# Patient Record
Sex: Female | Born: 1950
Health system: Southern US, Community
[De-identification: ages and names within clinical notes are randomized; demographics above are authoritative.]

## PROBLEM LIST (undated history)

## (undated) DIAGNOSIS — F32A Depression, unspecified: Secondary | ICD-10-CM

## (undated) DIAGNOSIS — R911 Solitary pulmonary nodule: Secondary | ICD-10-CM

## (undated) DIAGNOSIS — E785 Hyperlipidemia, unspecified: Secondary | ICD-10-CM

## (undated) DIAGNOSIS — Z8619 Personal history of other infectious and parasitic diseases: Secondary | ICD-10-CM

## (undated) DIAGNOSIS — C8290 Follicular lymphoma, unspecified, unspecified site: Secondary | ICD-10-CM

## (undated) DIAGNOSIS — I2510 Atherosclerotic heart disease of native coronary artery without angina pectoris: Secondary | ICD-10-CM

## (undated) DIAGNOSIS — M8580 Other specified disorders of bone density and structure, unspecified site: Secondary | ICD-10-CM

## (undated) DIAGNOSIS — H01139 Eczematous dermatitis of unspecified eye, unspecified eyelid: Secondary | ICD-10-CM

## (undated) DIAGNOSIS — I10 Essential (primary) hypertension: Secondary | ICD-10-CM

## (undated) DIAGNOSIS — T7840XA Allergy, unspecified, initial encounter: Secondary | ICD-10-CM

## (undated) DIAGNOSIS — F329 Major depressive disorder, single episode, unspecified: Secondary | ICD-10-CM

## (undated) DIAGNOSIS — K589 Irritable bowel syndrome without diarrhea: Secondary | ICD-10-CM

## (undated) HISTORY — PX: TONSILLECTOMY: SUR1361

## (undated) HISTORY — DX: Hyperlipidemia, unspecified: E78.5

## (undated) HISTORY — DX: Personal history of other infectious and parasitic diseases: Z86.19

## (undated) HISTORY — DX: Allergy, unspecified, initial encounter: T78.40XA

## (undated) HISTORY — PX: TUBAL LIGATION: SHX77

---

## 1987-05-20 HISTORY — PX: BREAST EXCISIONAL BIOPSY: SUR124

## 1987-05-20 HISTORY — PX: BREAST BIOPSY: SHX20

## 2007-12-01 LAB — HM COLONOSCOPY: HM Colonoscopy: NORMAL

## 2010-06-02 LAB — HM PAP SMEAR: HM Pap smear: NORMAL

## 2011-05-08 ENCOUNTER — Ambulatory Visit (INDEPENDENT_AMBULATORY_CARE_PROVIDER_SITE_OTHER): Payer: BC Managed Care – PPO | Admitting: Internal Medicine

## 2011-05-08 ENCOUNTER — Encounter: Payer: Self-pay | Admitting: Internal Medicine

## 2011-05-08 VITALS — BP 110/70 | HR 60 | Temp 98.3°F | Ht 61.75 in | Wt 133.2 lb

## 2011-05-08 DIAGNOSIS — Z79899 Other long term (current) drug therapy: Secondary | ICD-10-CM

## 2011-05-08 DIAGNOSIS — Z1239 Encounter for other screening for malignant neoplasm of breast: Secondary | ICD-10-CM

## 2011-05-08 DIAGNOSIS — F17200 Nicotine dependence, unspecified, uncomplicated: Secondary | ICD-10-CM

## 2011-05-08 DIAGNOSIS — Z716 Tobacco abuse counseling: Secondary | ICD-10-CM | POA: Insufficient documentation

## 2011-05-08 DIAGNOSIS — Z720 Tobacco use: Secondary | ICD-10-CM | POA: Insufficient documentation

## 2011-05-08 DIAGNOSIS — Z7189 Other specified counseling: Secondary | ICD-10-CM

## 2011-05-08 DIAGNOSIS — E785 Hyperlipidemia, unspecified: Secondary | ICD-10-CM

## 2011-05-08 MED ORDER — FLUTICASONE PROPIONATE 50 MCG/ACT NA SUSP: 2.0000 | Freq: Every day | NASAL | Status: DC

## 2011-05-08 MED ORDER — ZOLPIDEM TARTRATE 10 MG PO TABS: 5.0000 mg | ORAL_TABLET | Freq: Every evening | ORAL | Status: DC | PRN

## 2011-05-08 MED ORDER — LOVASTATIN 40 MG PO TABS: 40.0000 mg | ORAL_TABLET | Freq: Every day | ORAL | Status: DC

## 2011-05-08 NOTE — Progress Notes (Signed)
  Subjective:    Patient ID: Nicole York, female    DOB: Jul 25, 1950, 60 y.o.   MRN: [id number]  HPI  New patient ,referred by Annett Fabian,  Here for annual exam Last PAP [id number], no ptior abnormals.    Hi  Review of Systems     Objective:   Physical Exam     Assessment & Plan:   Subjective:    Nicole York is a 60 y.o. female who presents for an annual exam.The patient is sexually active. GYN screening history: last pap: was normal and approximate date 2011 and was normal. The patient wears seatbelts: yes. The patient participates in regular exercise: yes. Has the patient ever been transfused or tattooed?: no. The patient reports that there is not domestic violence in her life.   Menstrual History: OB History    Grav Para Term Preterm Abortions TAB SAB Ect Mult Living                  Menarche age: 25 No LMP recorded. Patient is postmenopausal.    The following portions of the patient's history were reviewed and updated as appropriate: allergies, current medications, past family history, past medical history, past social history, past surgical history and problem list.  Review of Systems A comprehensive review of systems was negative except for: Ears, nose, mouth, throat, and face: positive for nasal congestion    Objective:    BP 110/70  Pulse 60  Temp(Src) 98.3 F (36.8 C) (Oral)  Ht 5' 1.75" (1.568 m)  Wt 133 lb 4 oz (60.442 kg)  BMI 24.57 kg/m2 General appearance: alert, cooperative and appears stated age Head: Normocephalic, without obvious abnormality, atraumatic Eyes: conjunctivae/corneas clear. PERRL, EOM's intact. Fundi benign. Ears: normal TM's and external ear canals both ears Nose: Nares normal. Septum midline. Mucosa normal. No drainage or sinus tenderness. Throat: lips, mucosa, and tongue normal; teeth and gums normal Neck: no adenopathy, no carotid bruit, no JVD, supple, symmetrical, trachea midline and thyroid not enlarged, symmetric, no  tenderness/mass/nodules Lungs: clear to auscultation bilaterally Breasts: normal appearance, no masses or tenderness Heart: regular rate and rhythm, S1, S2 normal, no murmur, click, rub or gallop Abdomen: soft, non-tender; bowel sounds normal; no masses,  no organomegaly Extremities: extremities normal, atraumatic, no cyanosis or edema Pulses: 2+ and symmetric Skin: Skin color, texture, turgor normal. No rashes or lesions Neurologic: Alert and oriented X 3, normal strength and tone. Normal symmetric reflexes. Normal coordination and gait.    Assessment:    Healthy female exam.  Pelvic/PAP was not indicated and therefore not done.    Plan:     Blood tests: CBC with diff, Comprehensive metabolic panel and TSH. Breast self exam technique reviewed and patient encouraged to perform self-exam monthly. Follow up as needed. Mammogram.

## 2011-05-09 ENCOUNTER — Other Ambulatory Visit (INDEPENDENT_AMBULATORY_CARE_PROVIDER_SITE_OTHER): Payer: BC Managed Care – PPO | Admitting: *Deleted

## 2011-05-09 DIAGNOSIS — E785 Hyperlipidemia, unspecified: Secondary | ICD-10-CM

## 2011-05-09 DIAGNOSIS — Z79899 Other long term (current) drug therapy: Secondary | ICD-10-CM

## 2011-05-09 LAB — COMPREHENSIVE METABOLIC PANEL
ALT: 30 U/L (ref 0–35)
AST: 32 U/L (ref 0–37)
Albumin: 4.1 g/dL (ref 3.5–5.2)
CO2: 27 mEq/L (ref 19–32)
Calcium: 9.6 mg/dL (ref 8.4–10.5)
Chloride: 103 mEq/L (ref 96–112)
GFR: 69.09 mL/min (ref >60.00)
Potassium: 4.2 mEq/L (ref 3.5–5.1)
Sodium: 139 mEq/L (ref 135–145)
Total Protein: 6.9 g/dL (ref 6.0–8.3)

## 2011-05-09 LAB — LIPID PANEL
Cholesterol: 203 mg/dL — ABNORMAL HIGH (ref 0–200)
Total CHOL/HDL Ratio: 3
VLDL: 28.2 mg/dL (ref 0.0–40.0)

## 2011-06-03 LAB — HM MAMMOGRAPHY: HM Mammogram: NORMAL

## 2011-06-04 ENCOUNTER — Encounter: Payer: Self-pay | Admitting: Internal Medicine

## 2011-06-23 ENCOUNTER — Telehealth: Payer: Self-pay | Admitting: Internal Medicine

## 2011-06-23 ENCOUNTER — Ambulatory Visit: Payer: BC Managed Care – PPO | Admitting: Internal Medicine

## 2011-06-23 NOTE — Telephone Encounter (Signed)
[facility] Triage Call Report Triage Record Num: [id number] Operator: Caswell Corwin Patient Name: Nicole York Call Date & Time: 06/23/2011 1:35:06PM Patient Phone: [phone] PCP: Patient Gender: Female PCP Fax : Patient DOB: June 07, 1950 Practice Name: [facility] Reason for Call: Caller: [name]/Patient; PCP: Duncan Dull; CB#: [phone]; Call regarding Cough/Congestion that started 06/22/11 Am. Afebrile. Triaged URI and pt coughing up yellow sputum and needs to be seen in 24 hrs. Appt made for today at 1630 with [place]. Protocol(s) Used: Upper Respiratory Infection (URI) Recommended Outcome per Protocol: See Provider within 24 hours Reason for Outcome: Productive cough with colored sputum (other than clear or white sputum) Care Advice: ~ Use a cool mist humidifier to moisten air. Be sure to clean according to manufacturer's instructions. Increase fluids to 8-12 eight oz (1.6 to 2.4 liters) glasses per day, half of them to be water. Soups, popsicles, fruit juices, non-caffeinated sodas (unless restricting sodium intake), jello, broths, decaf teas, etc. are all okay. Warm fluids can be soothing. ~ ~ Warm fluids may help, or try a mixture of honey and lemon juice in warm tea. Coughing up mucus or phlegm helps to get rid of an infection. A productive cough should not be stopped. A cough medicine with guaifenesin (Robitussin, Mucinex) can help loosen the mucus. Cough medicine with dextromethorphan (DM) should be avoided. Drinking lots of fluids can help loosen the mucus too, especially warm fluids. ~ ~ Go to the ED if having chest pain with breathing or breathing is becoming more difficult. Analgesic/Antipyretic Advice - NSAIDs: Consider aspirin, ibuprofen, naproxen or ketoprofen for pain or fever as directed on label or by pharmacist/provider. PRECAUTIONS: - If over 67 years of age, should not take longer than 1 week without consulting provider. EXCEPTIONS: -  Should not be used if taking blood thinners or have bleeding problems. - Do not use if have history of sensitivity/allergy to any of these medications; or history of cardiovascular, ulcer, kidney, liver disease or diabetes unless approved by provider. - Do not exceed recommended dose or frequency.

## 2012-06-02 ENCOUNTER — Encounter: Payer: Self-pay | Admitting: Internal Medicine

## 2012-06-02 ENCOUNTER — Ambulatory Visit (INDEPENDENT_AMBULATORY_CARE_PROVIDER_SITE_OTHER): Payer: BC Managed Care – PPO | Admitting: Internal Medicine

## 2012-06-02 ENCOUNTER — Other Ambulatory Visit (HOSPITAL_COMMUNITY)
Admission: RE | Admit: 2012-06-02 | Discharge: 2012-06-02 | Disposition: A | Discharge status: Home / Self Care | Payer: BC Managed Care – PPO | Source: Ambulatory Visit | Attending: Internal Medicine | Admitting: Internal Medicine

## 2012-06-02 VITALS — BP 120/76 | HR 69 | Temp 97.7°F | Resp 16 | Ht 62.5 in | Wt 138.8 lb

## 2012-06-02 DIAGNOSIS — Z79899 Other long term (current) drug therapy: Secondary | ICD-10-CM

## 2012-06-02 DIAGNOSIS — J309 Allergic rhinitis, unspecified: Secondary | ICD-10-CM

## 2012-06-02 DIAGNOSIS — Z01419 Encounter for gynecological examination (general) (routine) without abnormal findings: Secondary | ICD-10-CM | POA: Insufficient documentation

## 2012-06-02 DIAGNOSIS — Z1239 Encounter for other screening for malignant neoplasm of breast: Secondary | ICD-10-CM

## 2012-06-02 DIAGNOSIS — Z0000 Encounter for general adult medical examination without abnormal findings: Secondary | ICD-10-CM

## 2012-06-02 DIAGNOSIS — R5383 Other fatigue: Secondary | ICD-10-CM

## 2012-06-02 DIAGNOSIS — E559 Vitamin D deficiency, unspecified: Secondary | ICD-10-CM

## 2012-06-02 DIAGNOSIS — E785 Hyperlipidemia, unspecified: Secondary | ICD-10-CM

## 2012-06-02 DIAGNOSIS — G4700 Insomnia, unspecified: Secondary | ICD-10-CM

## 2012-06-02 DIAGNOSIS — R5381 Other malaise: Secondary | ICD-10-CM

## 2012-06-02 DIAGNOSIS — Z1211 Encounter for screening for malignant neoplasm of colon: Secondary | ICD-10-CM

## 2012-06-02 DIAGNOSIS — Z1151 Encounter for screening for human papillomavirus (HPV): Secondary | ICD-10-CM | POA: Insufficient documentation

## 2012-06-02 DIAGNOSIS — Z139 Encounter for screening, unspecified: Secondary | ICD-10-CM

## 2012-06-02 LAB — COMPREHENSIVE METABOLIC PANEL
ALT: 37 U/L — ABNORMAL HIGH (ref 0–35)
AST: 29 U/L (ref 0–37)
Alkaline Phosphatase: 92 U/L (ref 39–117)
Creatinine, Ser: 1 mg/dL (ref 0.4–1.2)
Total Bilirubin: 0.6 mg/dL (ref 0.3–1.2)

## 2012-06-02 LAB — TSH: TSH: 0.73 u[IU]/mL (ref 0.35–5.50)

## 2012-06-02 LAB — CBC WITH DIFFERENTIAL/PLATELET
Eosinophils Relative: 1.6 % (ref 0.0–5.0)
MCV: 86.7 fl (ref 78.0–100.0)
Monocytes Absolute: 0.6 10*3/uL (ref 0.1–1.0)
Monocytes Relative: 8 % (ref 3.0–12.0)
Neutrophils Relative %: 61.1 % (ref 43.0–77.0)
Platelets: 314 10*3/uL (ref 150.0–400.0)
WBC: 7.1 10*3/uL (ref 4.5–10.5)

## 2012-06-02 LAB — LIPID PANEL
HDL: 58.9 mg/dL (ref >39.00)
Total CHOL/HDL Ratio: 4
VLDL: 36.2 mg/dL (ref 0.0–40.0)

## 2012-06-02 LAB — CK: Total CK: 108 U/L (ref 7–177)

## 2012-06-02 LAB — LDL CHOLESTEROL, DIRECT: Direct LDL: 131.5 mg/dL

## 2012-06-02 MED ORDER — LOVASTATIN 40 MG PO TABS: 20.0000 mg | ORAL_TABLET | Freq: Every day | ORAL | Status: DC

## 2012-06-02 MED ORDER — FLUTICASONE PROPIONATE 50 MCG/ACT NA SUSP: 2.0000 | Freq: Every day | NASAL | Status: DC

## 2012-06-02 MED ORDER — ZOLPIDEM TARTRATE 10 MG PO TABS: 5.0000 mg | ORAL_TABLET | Freq: Every evening | ORAL | Status: DC | PRN

## 2012-06-02 NOTE — Assessment & Plan Note (Signed)
Has been taking a statin for over one year . fasting lipids due.

## 2012-06-02 NOTE — Progress Notes (Signed)
Patient ID: Nicole York, female   DOB: [birth year], 62 y.o.   MRN: [id number]   Subjective:     Nicole York is a 62 y.o. female and is here for a comprehensive physical exam. The patient reports that on Dec 27th she had a viral uri .   Still having post nasal drip,  Thick  Clear.  Not takign any otc meds but initially used generic nyquil and dayquil  .     History   Social History  . Marital Status: Married    Spouse Name: N/A    Number of Children: N/A  . Years of Education: N/A   Occupational History  . Not on file.   Social History Main Topics  . Smoking status: Current Every Day Smoker -- 0.3 packs/day    Types: Cigarettes  . Smokeless tobacco: Not on file  . Alcohol Use: Yes     Comment: socially  . Drug Use: No  . Sexually Active: Not on file   Other Topics Concern  . Not on file   Social History Narrative  . No narrative on file   Health Maintenance  Topic Date Due  . Influenza Vaccine  01/18/2012  . Pap Smear  02/16/2013  . Mammogram  05/20/2013  . Colonoscopy  12/15/2017  . Tetanus/tdap  09/17/2019  . Zostavax  Completed    The following portions of the patient's history were reviewed and updated as appropriate: allergies, current medications, past family history, past medical history, past social history, past surgical history and problem list.  Review of Systems A comprehensive review of systems was negative.   Objective:    BP 120/76  Pulse 69  Temp 97.7 F (36.5 C) (Oral)  Resp 16  Ht 5' 2.5" (1.588 m)  Wt 138 lb 12 oz (62.937 kg)  BMI 24.97 kg/m2  SpO2 97%  General Appearance:    Alert, cooperative, no distress, appears stated age  Head:    Normocephalic, without obvious abnormality, atraumatic  Eyes:    PERRL, conjunctiva/corneas clear, EOM's intact, fundi    benign, both eyes  Ears:    Normal TM's and external ear canals, both ears  Nose:   Nares normal, septum midline, mucosa normal, no drainage    or sinus tenderness  Throat:   Lips,  mucosa, and tongue normal; teeth and gums normal  Neck:   Supple, symmetrical, trachea midline, no adenopathy;    thyroid:  no enlargement/tenderness/nodules; no carotid   bruit or JVD  Back:     Symmetric, no curvature, ROM normal, no CVA tenderness  Lungs:     Clear to auscultation bilaterally, respirations unlabored  Chest Wall:    No tenderness or deformity   Heart:    Regular rate and rhythm, S1 and S2 normal, no murmur, rub   or gallop  Breast Exam:    No tenderness, masses, or nipple abnormality  Abdomen:     Soft, non-tender, bowel sounds active all four quadrants,    no masses, no organomegaly  Genitalia:    Normal female without lesion, discharge or tenderness     Extremities:   Extremities normal, atraumatic, no cyanosis or edema  Pulses:   2+ and symmetric all extremities  Skin:   Skin color, texture, turgor normal, no rashes or lesions  Lymph nodes:   Cervical, supraclavicular, and axillary nodes normal  Neurologic:   CNII-XII intact, normal strength, sensation and reflexes    throughout    Assessment:   Other  and unspecified hyperlipidemia Has been taking a statin for over one year . fasting lipids due.   Encounter for long-term (current) use of other medications She is overdue for LFTs   Routine general medical examination at a health care facility Breast pelvic and PAP done today   Updated Medication List Outpatient Encounter Prescriptions as of 06/02/2012  Medication Sig Dispense Refill  . aspirin 325 MG tablet Take 325 mg by mouth daily.        . cetirizine (ZYRTEC) 10 MG tablet Take 10 mg by mouth daily.        . cholecalciferol (VITAMIN D) 1000 UNITS tablet Take 1,000 Units by mouth daily.        . fish oil-omega-3 fatty acids 1000 MG capsule Take 2 g by mouth daily.        . fluticasone (FLONASE) 50 MCG/ACT nasal spray Place 2 sprays into the nose daily.  16 g  11  . Lactobacillus (ACIDOPHILUS PO) Take by mouth.        . lovastatin (MEVACOR) 40 MG tablet  Take 0.5 tablets (20 mg total) by mouth at bedtime.  30 tablet  3  . MULTIPLE VITAMIN PO Take by mouth.        . thiamine (VITAMIN B-1) 100 MG tablet Take 100 mg by mouth daily.        . vitamin E 400 UNIT capsule Take 400 Units by mouth daily.        Marland Kitchen zolpidem (AMBIEN) 10 MG tablet Take 0.5 tablets (5 mg total) by mouth at bedtime as needed.  30 tablet  5  . [DISCONTINUED] fluticasone (FLONASE) 50 MCG/ACT nasal spray Place 2 sprays into the nose daily.  16 g  11  . [DISCONTINUED] lovastatin (MEVACOR) 40 MG tablet Take 1 tablet (40 mg total) by mouth at bedtime.  30 tablet  3  . [DISCONTINUED] zolpidem (AMBIEN) 10 MG tablet Take 0.5 tablets (5 mg total) by mouth at bedtime as needed.  30 tablet  3  . [DISCONTINUED] Multiple Vitamin (ANTIOXIDANT PO) Take by mouth.

## 2012-06-02 NOTE — Assessment & Plan Note (Signed)
She is overdue for LFTs

## 2012-06-02 NOTE — Patient Instructions (Addendum)
Please retrun in 6 months for repeat labs to monitor liver enzymes  Mammogram and fecal occult blood test due for screening.

## 2012-06-02 NOTE — Assessment & Plan Note (Signed)
Breast pelvic and PAP done today.  

## 2012-06-03 LAB — VITAMIN D 25 HYDROXY (VIT D DEFICIENCY, FRACTURES): Vit D, 25-Hydroxy: 47 ng/mL (ref 30–89)

## 2012-06-10 LAB — HM MAMMOGRAPHY: HM Mammogram: NEGATIVE

## 2012-06-11 ENCOUNTER — Encounter: Payer: Self-pay | Admitting: Internal Medicine

## 2012-06-17 LAB — HM PAP SMEAR: HM Pap smear: NORMAL

## 2012-06-21 ENCOUNTER — Other Ambulatory Visit: Payer: Self-pay | Admitting: Internal Medicine

## 2012-06-21 ENCOUNTER — Other Ambulatory Visit (INDEPENDENT_AMBULATORY_CARE_PROVIDER_SITE_OTHER): Payer: BC Managed Care – PPO

## 2012-06-21 DIAGNOSIS — D649 Anemia, unspecified: Secondary | ICD-10-CM

## 2012-06-21 DIAGNOSIS — Z1211 Encounter for screening for malignant neoplasm of colon: Secondary | ICD-10-CM

## 2012-06-21 LAB — FECAL OCCULT BLOOD, IMMUNOCHEMICAL: Fecal Occult Bld: NEGATIVE

## 2012-07-15 ENCOUNTER — Other Ambulatory Visit: Payer: Self-pay | Admitting: Internal Medicine

## 2012-11-25 ENCOUNTER — Encounter: Payer: Self-pay | Admitting: Internal Medicine

## 2013-03-24 ENCOUNTER — Other Ambulatory Visit: Payer: Self-pay

## 2013-06-23 ENCOUNTER — Other Ambulatory Visit: Payer: Self-pay | Admitting: Internal Medicine

## 2013-07-21 ENCOUNTER — Other Ambulatory Visit: Payer: Self-pay | Admitting: Internal Medicine

## 2013-07-25 NOTE — Telephone Encounter (Signed)
Mailed unread message to pt  

## 2013-08-11 ENCOUNTER — Telehealth: Payer: Self-pay | Admitting: Internal Medicine

## 2013-08-11 NOTE — Telephone Encounter (Signed)
Pt left vm to schedule cpe.  Returned pt call, lvmtcb to schedule cpe.

## 2013-08-12 ENCOUNTER — Telehealth: Payer: Self-pay | Admitting: Internal Medicine

## 2013-08-12 NOTE — Telephone Encounter (Signed)
No refills until she has CMET nad fasting lipids done,  She is overdue by 6 months

## 2013-08-12 NOTE — Telephone Encounter (Signed)
Has appointment scheduled for 09/15/13  Last lipid was 06/02/12 please advise as to refill?

## 2013-08-12 NOTE — Telephone Encounter (Signed)
Pt has scheduled cpe 4/30.  States she needs cholesterol medication [facility] [address] it is not quite time but she will need one more.

## 2013-08-15 NOTE — Telephone Encounter (Signed)
Left message for patient to call office.  

## 2013-08-16 NOTE — Telephone Encounter (Signed)
Patient has fasting labs scheduled for the 08/24/13.

## 2013-08-23 ENCOUNTER — Telehealth: Payer: Self-pay | Admitting: *Deleted

## 2013-08-23 DIAGNOSIS — R5381 Other malaise: Secondary | ICD-10-CM

## 2013-08-23 DIAGNOSIS — R5383 Other fatigue: Secondary | ICD-10-CM

## 2013-08-23 DIAGNOSIS — E785 Hyperlipidemia, unspecified: Secondary | ICD-10-CM

## 2013-08-23 DIAGNOSIS — E559 Vitamin D deficiency, unspecified: Secondary | ICD-10-CM

## 2013-08-23 NOTE — Telephone Encounter (Signed)
Pt is coming in tomorrow what labs and dx?  

## 2013-08-24 ENCOUNTER — Other Ambulatory Visit (INDEPENDENT_AMBULATORY_CARE_PROVIDER_SITE_OTHER): Payer: BC Managed Care – PPO

## 2013-08-24 DIAGNOSIS — E785 Hyperlipidemia, unspecified: Secondary | ICD-10-CM

## 2013-08-24 DIAGNOSIS — R5383 Other fatigue: Principal | ICD-10-CM

## 2013-08-24 DIAGNOSIS — E559 Vitamin D deficiency, unspecified: Secondary | ICD-10-CM

## 2013-08-24 DIAGNOSIS — R5381 Other malaise: Secondary | ICD-10-CM

## 2013-08-24 LAB — COMPREHENSIVE METABOLIC PANEL
ALT: 25 U/L (ref 0–35)
AST: 26 U/L (ref 0–37)
Albumin: 4 g/dL (ref 3.5–5.2)
Alkaline Phosphatase: 78 U/L (ref 39–117)
BUN: 11 mg/dL (ref 6–23)
CO2: 27 mEq/L (ref 19–32)
CREATININE: 0.9 mg/dL (ref 0.4–1.2)
Calcium: 9.5 mg/dL (ref 8.4–10.5)
Chloride: 106 mEq/L (ref 96–112)
GFR: 67.7 mL/min (ref >60.00)
Glucose, Bld: 100 mg/dL — ABNORMAL HIGH (ref 70–99)
Potassium: 4.4 mEq/L (ref 3.5–5.1)
Sodium: 140 mEq/L (ref 135–145)
Total Bilirubin: 0.7 mg/dL (ref 0.3–1.2)
Total Protein: 7.2 g/dL (ref 6.0–8.3)

## 2013-08-24 LAB — LIPID PANEL
CHOL/HDL RATIO: 3
Cholesterol: 169 mg/dL (ref 0–200)
HDL: 57 mg/dL (ref >39.00)
LDL Cholesterol: 93 mg/dL (ref 0–99)
TRIGLYCERIDES: 93 mg/dL (ref 0.0–149.0)
VLDL: 18.6 mg/dL (ref 0.0–40.0)

## 2013-08-24 LAB — TSH: TSH: 1.14 u[IU]/mL (ref 0.35–5.50)

## 2013-08-25 LAB — VITAMIN D 25 HYDROXY (VIT D DEFICIENCY, FRACTURES): VIT D 25 HYDROXY: 62 ng/mL (ref 30–89)

## 2013-08-26 ENCOUNTER — Encounter: Payer: Self-pay | Admitting: Internal Medicine

## 2013-08-29 NOTE — Telephone Encounter (Signed)
Mailed unread message to pt  

## 2013-09-06 NOTE — Telephone Encounter (Signed)
Patient was scheduled to come in on 06/23/11 however she cancelled this appt. I am closing this encounter after doing research.

## 2013-09-10 ENCOUNTER — Other Ambulatory Visit: Payer: Self-pay | Admitting: Internal Medicine

## 2013-09-15 ENCOUNTER — Encounter: Payer: Self-pay | Admitting: Internal Medicine

## 2013-09-15 ENCOUNTER — Ambulatory Visit (INDEPENDENT_AMBULATORY_CARE_PROVIDER_SITE_OTHER): Payer: BC Managed Care – PPO | Admitting: Internal Medicine

## 2013-09-15 VITALS — BP 122/64 | HR 64 | Temp 98.1°F | Resp 16 | Ht 61.25 in | Wt 125.0 lb

## 2013-09-15 DIAGNOSIS — E785 Hyperlipidemia, unspecified: Secondary | ICD-10-CM

## 2013-09-15 DIAGNOSIS — F411 Generalized anxiety disorder: Secondary | ICD-10-CM

## 2013-09-15 DIAGNOSIS — F17200 Nicotine dependence, unspecified, uncomplicated: Secondary | ICD-10-CM

## 2013-09-15 DIAGNOSIS — F419 Anxiety disorder, unspecified: Secondary | ICD-10-CM

## 2013-09-15 DIAGNOSIS — F5105 Insomnia due to other mental disorder: Secondary | ICD-10-CM

## 2013-09-15 DIAGNOSIS — Z7189 Other specified counseling: Secondary | ICD-10-CM

## 2013-09-15 DIAGNOSIS — Z1239 Encounter for other screening for malignant neoplasm of breast: Secondary | ICD-10-CM

## 2013-09-15 DIAGNOSIS — G4700 Insomnia, unspecified: Secondary | ICD-10-CM

## 2013-09-15 DIAGNOSIS — Z716 Tobacco abuse counseling: Secondary | ICD-10-CM

## 2013-09-15 DIAGNOSIS — Z0000 Encounter for general adult medical examination without abnormal findings: Secondary | ICD-10-CM

## 2013-09-15 DIAGNOSIS — F489 Nonpsychotic mental disorder, unspecified: Secondary | ICD-10-CM

## 2013-09-15 MED ORDER — ZOLPIDEM TARTRATE 10 MG PO TABS: 10.0000 mg | ORAL_TABLET | Freq: Every evening | ORAL | Status: DC | PRN

## 2013-09-15 NOTE — Progress Notes (Signed)
Patient ID: Nicole York, female   DOB: Mar 30, 1951, 64 y.o.   MRN: [id number]  Subjective:     Nicole York is a 63 y.o. female and is here for a comprehensive physical exam. The patient reports Increased insomnia. Citing family stressors, requesting ambien ,  Father had a cva with dysphagia,  Mother has AD and was hospitlaized for a month,  Left yesterday,  husband has been diagnosed with prostate Ca and DM   History   Social History  . Marital Status: Married    Spouse Name: N/A    Number of Children: N/A  . Years of Education: N/A   Occupational History  . Not on file.   Social History Main Topics  . Smoking status: Current Every Day Smoker -- 0.30 packs/day    Types: Cigarettes  . Smokeless tobacco: Not on file  . Alcohol Use: Yes     Comment: socially  . Drug Use: No  . Sexual Activity: Not on file   Other Topics Concern  . Not on file   Social History Narrative  . No narrative on file   Health Maintenance  Topic Date Due  . Influenza Vaccine  12/17/2013  . Mammogram  06/10/2014  . Pap Smear  06/18/2015  . Colonoscopy  06/02/2018  . Tetanus/tdap  06/02/2020  . Zostavax  Completed    The following portions of the patient's history were reviewed and updated as appropriate: allergies, current medications, past family history, past medical history, past social history, past surgical history and problem list.  Review of Systems A comprehensive review of systems was negative.   Objective:   BP 122/64  Pulse 64  Temp(Src) 98.1 F (36.7 C) (Oral)  Resp 16  Ht 5' 1.25" (1.556 m)  Wt 125 lb (56.7 kg)  BMI 23.42 kg/m2  SpO2 99%   General appearance: alert, cooperative and appears stated age Head: Normocephalic, without obvious abnormality, atraumatic Eyes: conjunctivae/corneas clear. PERRL, EOM's intact. Fundi benign. Ears: normal TM's and external ear canals both ears Nose: Nares normal. Septum midline. Mucosa normal. No drainage or sinus  tenderness. Throat: lips, mucosa, and tongue normal; teeth and gums normal Neck: no adenopathy, no carotid bruit, no JVD, supple, symmetrical, trachea midline and thyroid not enlarged, symmetric, no tenderness/mass/nodules Lungs: clear to auscultation bilaterally Breasts: normal appearance, no masses or tenderness Heart: regular rate and rhythm, S1, S2 normal, no murmur, click, rub or gallop Abdomen: soft, non-tender; bowel sounds normal; no masses,  no organomegaly Extremities: extremities normal, atraumatic, no cyanosis or edema Pulses: 2+ and symmetric Skin: Skin color, texture, turgor normal. No rashes or lesions Neurologic: Alert and oriented X 3, normal strength and tone. Normal symmetric reflexes. Normal coordination and gait.    Assessment and plan:   Tobacco abuse counseling Smoking cessation instruction/counseling given:  counseled patient on the dangers of tobacco use, advised patient to stop smoking, and reviewed strategies to maximize success  Other and unspecified hyperlipidemia LDL and triglycerides are at goal on current medications. He has no side effects and liver enzymes are normal. No changes today  Lab Results  Component Value Date   CHOL 169 08/24/2013   HDL 57.00 08/24/2013   LDLCALC 93 08/24/2013   LDLDIRECT 131.5 06/02/2012   TRIG 93.0 08/24/2013   CHOLHDL 3 08/24/2013   Lab Results  Component Value Date   ALT 25 08/24/2013   AST 26 08/24/2013   ALKPHOS 78 08/24/2013   BILITOT 0.7 08/24/2013    Insomnia secondary to  anxiety Secondary to caregiver stress.  Discussed starting SSRI in near future.    Updated Medication List Outpatient Encounter Prescriptions as of 09/15/2013  Medication Sig  . aspirin 325 MG tablet Take 325 mg by mouth daily.    . cetirizine (ZYRTEC) 10 MG tablet Take 10 mg by mouth daily.    . cholecalciferol (VITAMIN D) 1000 UNITS tablet Take 1,000 Units by mouth daily.    . fish oil-omega-3 fatty acids 1000 MG capsule Take 2 g by mouth daily.    .  fluticasone (FLONASE) 50 MCG/ACT nasal spray USE 2 SPRAY IN EACH NOSTRIL EVERY DAY  . Lactobacillus (ACIDOPHILUS PO) Take by mouth.    . lovastatin (MEVACOR) 40 MG tablet Take 40 mg by mouth at bedtime.  . MULTIPLE VITAMIN PO Take by mouth.    . thiamine (VITAMIN B-1) 100 MG tablet Take 100 mg by mouth daily.    . vitamin E 400 UNIT capsule Take 400 Units by mouth daily.    Marland Kitchen zolpidem (AMBIEN) 10 MG tablet Take 1 tablet (10 mg total) by mouth at bedtime as needed.  . [DISCONTINUED] lovastatin (MEVACOR) 40 MG tablet Take 0.5 tablets (20 mg total) by mouth at bedtime.  . [DISCONTINUED] zolpidem (AMBIEN) 10 MG tablet Take 0.5 tablets (5 mg total) by mouth at bedtime as needed.  . [DISCONTINUED] lovastatin (MEVACOR) 40 MG tablet TAKE 1 TABLET BY MOUTH AT BEDTIME

## 2013-09-15 NOTE — Patient Instructions (Signed)
You had your annual  wellness exam today.  We will repeat your PAP smear in 2017, sooner if needed   We will schedule your mammogram ASAP  Stool test annually until 2019  For colon CA screening   If the ambien doesn't improve your insomnia and anxiety,  I advise you to consider starting a daytime medication for anxiety for a few months (lexapro,  Zoloft,  Or paxil)

## 2013-09-16 ENCOUNTER — Telehealth: Payer: Self-pay | Admitting: Internal Medicine

## 2013-09-16 NOTE — Telephone Encounter (Signed)
Relevant patient education assigned to patient using Emmi. ° °

## 2013-09-17 DIAGNOSIS — F5105 Insomnia due to other mental disorder: Secondary | ICD-10-CM

## 2013-09-17 DIAGNOSIS — F419 Anxiety disorder, unspecified: Secondary | ICD-10-CM | POA: Insufficient documentation

## 2013-09-17 NOTE — Assessment & Plan Note (Signed)
LDL and triglycerides are at goal on current medications. He has no side effects and liver enzymes are normal. No changes today  Lab Results  Component Value Date   CHOL 169 08/24/2013   HDL 57.00 08/24/2013   LDLCALC 93 08/24/2013   LDLDIRECT 131.5 06/02/2012   TRIG 93.0 08/24/2013   CHOLHDL 3 08/24/2013   Lab Results  Component Value Date   ALT 25 08/24/2013   AST 26 08/24/2013   ALKPHOS 78 08/24/2013   BILITOT 0.7 08/24/2013

## 2013-09-17 NOTE — Assessment & Plan Note (Signed)
Secondary to caregiver stress.  Discussed starting SSRI in near future.

## 2013-09-17 NOTE — Assessment & Plan Note (Signed)
Smoking cessation instruction/counseling given:  counseled patient on the dangers of tobacco use, advised patient to stop smoking, and reviewed strategies to maximize success 

## 2013-09-22 ENCOUNTER — Encounter: Payer: Self-pay | Admitting: *Deleted

## 2013-10-10 ENCOUNTER — Other Ambulatory Visit: Payer: Self-pay | Admitting: Internal Medicine

## 2013-10-11 ENCOUNTER — Encounter: Payer: Self-pay | Admitting: Internal Medicine

## 2013-10-11 DIAGNOSIS — F5105 Insomnia due to other mental disorder: Principal | ICD-10-CM

## 2013-10-11 DIAGNOSIS — F419 Anxiety disorder, unspecified: Secondary | ICD-10-CM

## 2013-10-13 MED ORDER — ESCITALOPRAM OXALATE 5 MG PO TABS: 5.0000 mg | ORAL_TABLET | Freq: Every day | ORAL | Status: DC

## 2013-10-19 ENCOUNTER — Encounter: Payer: Self-pay | Admitting: Internal Medicine

## 2013-11-13 ENCOUNTER — Other Ambulatory Visit: Payer: Self-pay | Admitting: Internal Medicine

## 2013-11-14 ENCOUNTER — Encounter: Payer: Self-pay | Admitting: Internal Medicine

## 2013-11-14 ENCOUNTER — Telehealth: Payer: Self-pay | Admitting: Internal Medicine

## 2013-11-14 NOTE — Telephone Encounter (Signed)
Called pharmacy and pharmacy stated medication was filled 11/09/13 and picked up by patient. Patient stated that she has lost medication, after i informed  Patient that the medication was filled on the 24 and not due for refill again until 12/13/13. Please advise.

## 2013-11-14 NOTE — Telephone Encounter (Signed)
Disregard previous message, pt sent mychart saying she found it in her car.

## 2013-11-14 NOTE — Telephone Encounter (Signed)
Pt came in with medication bottle for escitalopram 5 mg.  States this was prescribed by Dr. Derrel Nip and it worked great for her.  States she was unable to refill due to it being "denied" but her bottle stated she has refills.  Pt asking to have this refilled again by Dr. Derrel Nip.

## 2013-12-14 ENCOUNTER — Other Ambulatory Visit: Payer: Self-pay | Admitting: Internal Medicine

## 2013-12-14 NOTE — Telephone Encounter (Signed)
Last refill 6.24.15, last OV 4.30.15.  Please advise refill.

## 2013-12-14 NOTE — Telephone Encounter (Signed)
Ok to refill,  Refill sent  

## 2014-01-27 ENCOUNTER — Encounter: Payer: Self-pay | Admitting: Podiatry

## 2014-01-27 ENCOUNTER — Ambulatory Visit (INDEPENDENT_AMBULATORY_CARE_PROVIDER_SITE_OTHER): Payer: BC Managed Care – PPO | Admitting: Podiatry

## 2014-01-27 VITALS — BP 122/70 | HR 72 | Resp 16

## 2014-01-27 DIAGNOSIS — M2010 Hallux valgus (acquired), unspecified foot: Secondary | ICD-10-CM

## 2014-01-27 DIAGNOSIS — L600 Ingrowing nail: Secondary | ICD-10-CM

## 2014-01-27 DIAGNOSIS — M79609 Pain in unspecified limb: Secondary | ICD-10-CM

## 2014-01-27 NOTE — Patient Instructions (Signed)

## 2014-01-27 NOTE — Progress Notes (Signed)
Subjective:     Patient ID: Nicole York, female   DOB: 08-03-1950, 63 y.o.   MRN: [id number]  HPI patient presents with painful right hallux nail lateral border that she states she cannot cut or cannot soak and cannot get better herself. Also discussed the bunion on the left foot which has been periodically a problem for her   Review of Systems     Objective:   Physical Exam Neurovascular status intact with muscle strength adequate and range of motion subtalar and midtarsal joint within normal limits. Patient is noted to have incurvated right hallux lateral border that's painful when pressed and on the left first metatarsal there is a prominence with redness and discomfort upon deep palpation    Assessment:     Ingrown toenail deformity right hallux lateral border with structural bunion deformity left that is generally not symptomatic that can give her a problem with certain types of activities    Plan:     Discussed both conditions and we will again not do surgery on the left and less becomes a problem. For the right I have recommended removal of the corner and I explained the procedure and risk and she wants to procedure done. Infiltrated 60 mg Xylocaine Marcaine mixture remove the lateral corner exposed matrix and applied phenol 3 applications 30 seconds followed by alcohol lavaged and sterile dressing. Instructed on soaks and reappoint

## 2014-02-06 ENCOUNTER — Other Ambulatory Visit: Payer: Self-pay | Admitting: Internal Medicine

## 2014-03-29 ENCOUNTER — Other Ambulatory Visit: Payer: Self-pay | Admitting: Internal Medicine

## 2014-03-31 NOTE — Telephone Encounter (Signed)
Mailed unread message to pt  

## 2014-04-06 ENCOUNTER — Other Ambulatory Visit: Payer: Self-pay | Admitting: Internal Medicine

## 2014-05-01 ENCOUNTER — Other Ambulatory Visit: Payer: Self-pay | Admitting: Internal Medicine

## 2014-05-02 ENCOUNTER — Other Ambulatory Visit: Payer: Self-pay | Admitting: Internal Medicine

## 2014-05-04 ENCOUNTER — Telehealth: Payer: Self-pay | Admitting: *Deleted

## 2014-05-04 DIAGNOSIS — E785 Hyperlipidemia, unspecified: Secondary | ICD-10-CM

## 2014-05-04 DIAGNOSIS — Z79899 Other long term (current) drug therapy: Secondary | ICD-10-CM

## 2014-05-04 NOTE — Addendum Note (Signed)
Addended by: Crecencio Mc on: 05/04/2014 11:27 PM   Modules accepted: Orders

## 2014-05-04 NOTE — Telephone Encounter (Signed)
Pt is coming in tomorrow what labs and dx?  

## 2014-05-05 ENCOUNTER — Other Ambulatory Visit (INDEPENDENT_AMBULATORY_CARE_PROVIDER_SITE_OTHER): Payer: Medicare Other

## 2014-05-05 DIAGNOSIS — E785 Hyperlipidemia, unspecified: Secondary | ICD-10-CM

## 2014-05-05 DIAGNOSIS — Z79899 Other long term (current) drug therapy: Secondary | ICD-10-CM

## 2014-05-05 LAB — COMPREHENSIVE METABOLIC PANEL
ALT: 24 U/L (ref 0–35)
AST: 26 U/L (ref 0–37)
Albumin: 4.1 g/dL (ref 3.5–5.2)
Alkaline Phosphatase: 76 U/L (ref 39–117)
BILIRUBIN TOTAL: 0.6 mg/dL (ref 0.2–1.2)
BUN: 14 mg/dL (ref 6–23)
CO2: 26 mEq/L (ref 19–32)
Calcium: 9.5 mg/dL (ref 8.4–10.5)
Chloride: 104 mEq/L (ref 96–112)
Creatinine, Ser: 0.9 mg/dL (ref 0.4–1.2)
GFR: 66.69 mL/min (ref >60.00)
Glucose, Bld: 103 mg/dL — ABNORMAL HIGH (ref 70–99)
Potassium: 4.6 mEq/L (ref 3.5–5.1)
SODIUM: 140 meq/L (ref 135–145)
TOTAL PROTEIN: 7.1 g/dL (ref 6.0–8.3)

## 2014-05-05 LAB — LIPID PANEL
CHOL/HDL RATIO: 3
Cholesterol: 182 mg/dL (ref 0–200)
HDL: 55.3 mg/dL (ref >39.00)
LDL Cholesterol: 99 mg/dL (ref 0–99)
NONHDL: 126.7
Triglycerides: 139 mg/dL (ref 0.0–149.0)
VLDL: 27.8 mg/dL (ref 0.0–40.0)

## 2014-05-08 ENCOUNTER — Ambulatory Visit (INDEPENDENT_AMBULATORY_CARE_PROVIDER_SITE_OTHER): Payer: Medicare Other | Admitting: Internal Medicine

## 2014-05-08 ENCOUNTER — Encounter: Payer: Self-pay | Admitting: Internal Medicine

## 2014-05-08 VITALS — BP 124/68 | HR 63 | Temp 97.9°F | Resp 14 | Ht 61.25 in | Wt 126.8 lb

## 2014-05-08 DIAGNOSIS — F419 Anxiety disorder, unspecified: Secondary | ICD-10-CM

## 2014-05-08 DIAGNOSIS — G4700 Insomnia, unspecified: Secondary | ICD-10-CM

## 2014-05-08 DIAGNOSIS — Z716 Tobacco abuse counseling: Secondary | ICD-10-CM

## 2014-05-08 DIAGNOSIS — F5105 Insomnia due to other mental disorder: Secondary | ICD-10-CM

## 2014-05-08 DIAGNOSIS — Z23 Encounter for immunization: Secondary | ICD-10-CM

## 2014-05-08 DIAGNOSIS — E785 Hyperlipidemia, unspecified: Secondary | ICD-10-CM

## 2014-05-08 MED ORDER — ESCITALOPRAM OXALATE 5 MG PO TABS: 5.0000 mg | ORAL_TABLET | Freq: Every day | ORAL | Status: AC

## 2014-05-08 MED ORDER — ZOLPIDEM TARTRATE 10 MG PO TABS: 10.0000 mg | ORAL_TABLET | Freq: Every evening | ORAL | Status: DC | PRN

## 2014-05-08 NOTE — Patient Instructions (Addendum)
I have refilled the lexapro and the ambien for future use   LDL and triglycerides are at goal on current medications.   We will repeat your labs in 6 months   Congratulations on your decision to quit smoking!!  If you need moral support call us!

## 2014-05-08 NOTE — Progress Notes (Signed)
Patient ID: Nicole York, female   DOB: Aug 05, 1950, 63 y.o.   MRN: [id number] Patient Active Problem List   Diagnosis Date Noted  . Insomnia secondary to anxiety 09/17/2013  . Visit for preventive health examination 09/15/2013  . Hyperlipidemia LDL goal <100 06/02/2012  . Encounter for long-term (current) use of other medications 06/02/2012  . Routine general medical examination at a health care facility 06/02/2012  . Tobacco abuse 05/08/2011  . Tobacco abuse counseling 05/08/2011    Subjective:  CC:   Chief Complaint  Patient presents with  . Medication Refill    lovastatin refill needed, has questions about lexapro Rx    HPI:   Nicole York is a 63 y.o. female who presents for 6 month follow up on GAD, hyperlipidemia and insomnia.  She has been [place] free x one month.  Stopped the lexapro 2 months ago after several episodes of loss of memory for specific tasks. No recurrence since then.  She has decided to quit smoking as of January 1.    Past Medical History  Diagnosis Date  . History of chicken pox   . Allergy   . Hyperlipidemia     Past Surgical History  Procedure Laterality Date  . Breast biopsy    . Tonsillectomy    . Tubal ligation         The following portions of the patient's history were reviewed and updated as appropriate: Allergies, current medications, and problem list.    Review of Systems:   Patient denies headache, fevers, malaise, unintentional weight loss, skin rash, eye pain, sinus congestion and sinus pain, sore throat, dysphagia,  hemoptysis , cough, dyspnea, wheezing, chest pain, palpitations, orthopnea, edema, abdominal pain, nausea, melena, diarrhea, constipation, flank pain, dysuria, hematuria, urinary  Frequency, nocturia, numbness, tingling, seizures,  Focal weakness, Loss of consciousness,  Tremor, insomnia, depression, anxiety, and suicidal ideation.     History   Social History  . Marital Status: Married    Spouse Name: N/A     Number of Children: N/A  . Years of Education: N/A   Occupational History  . Not on file.   Social History Main Topics  . Smoking status: Former Smoker -- 0.30 packs/day    Types: Cigarettes    Quit date: 05/04/2014  . Smokeless tobacco: Not on file  . Alcohol Use: 0.0 oz/week    0 Not specified per week     Comment: socially  . Drug Use: No  . Sexual Activity: Not on file   Other Topics Concern  . Not on file   Social History Narrative    Objective:  Filed Vitals:   05/08/14 1653  BP: 124/68  Pulse: 63  Temp: 97.9 F (36.6 C)  Resp: 14     General appearance: alert, cooperative and appears stated age Ears: normal TM's and external ear canals both ears Throat: lips, mucosa, and tongue normal; teeth and gums normal Neck: no adenopathy, no carotid bruit, supple, symmetrical, trachea midline and thyroid not enlarged, symmetric, no tenderness/mass/nodules Back: symmetric, no curvature. ROM normal. No CVA tenderness. Lungs: clear to auscultation bilaterally Heart: regular rate and rhythm, S1, S2 normal, no murmur, click, rub or gallop Abdomen: soft, non-tender; bowel sounds normal; no masses,  no organomegaly Pulses: 2+ and symmetric Skin: Skin color, texture, turgor normal. No rashes or lesions Lymph nodes: Cervical, supraclavicular, and axillary nodes normal.  Assessment and Plan:  Problem List Items Addressed This Visit    Hyperlipidemia LDL goal <100  LDL and triglycerides are at goal on current medications. He has no side effects and liver enzymes are normal.   Lab Results  Component Value Date   CHOL 182 05/05/2014   HDL 55.30 05/05/2014   LDLCALC 99 05/05/2014   LDLDIRECT 131.5 06/02/2012   TRIG 139.0 05/05/2014   CHOLHDL 3 05/05/2014   Lab Results  Component Value Date   ALT 24 05/05/2014   AST 26 05/05/2014   ALKPHOS 76 05/05/2014   BILITOT 0.6 05/05/2014       Insomnia secondary to anxiety    Improved,  No longer on lexapro or ambien ,  but requesting refills for future use given plans to quit smoking     Relevant Medications      escitalopram (LEXAPRO) tablet   Tobacco abuse counseling    Smoking cessation instruction/counseling given:  commended patient for quitting and reviewed strategies for preventing relapses     Other Visit Diagnoses    Insomnia    -  Primary    Relevant Medications       zolpidem (AMBIEN) tablet    Need for vaccination with 13-polyvalent pneumococcal conjugate vaccine        Relevant Orders       Pneumococcal conjugate vaccine 13-valent (Completed)      A total of 40 minutes of face to face time was spent with patient more than half of which was spent in counselling and coordination of care

## 2014-05-08 NOTE — Progress Notes (Signed)
Pre visit review using our clinic review tool, if applicable. No additional management support is needed unless otherwise documented below in the visit note. 

## 2014-05-11 NOTE — Assessment & Plan Note (Addendum)
LDL and triglycerides are at goal on current medications. He has no side effects and liver enzymes are normal.   Lab Results  Component Value Date   CHOL 182 05/05/2014   HDL 55.30 05/05/2014   LDLCALC 99 05/05/2014   LDLDIRECT 131.5 06/02/2012   TRIG 139.0 05/05/2014   CHOLHDL 3 05/05/2014   Lab Results  Component Value Date   ALT 24 05/05/2014   AST 26 05/05/2014   ALKPHOS 76 05/05/2014   BILITOT 0.6 05/05/2014

## 2014-05-11 NOTE — Assessment & Plan Note (Addendum)
Improved,  No longer on lexapro or ambien , but requesting refills for future use given plans to quit smoking

## 2014-05-11 NOTE — Assessment & Plan Note (Signed)
Smoking cessation instruction/counseling given:  commended patient for quitting and reviewed strategies for preventing relapses 

## 2014-05-22 ENCOUNTER — Other Ambulatory Visit: Payer: Self-pay | Admitting: Internal Medicine

## 2014-06-20 ENCOUNTER — Other Ambulatory Visit: Payer: Self-pay | Admitting: Internal Medicine

## 2014-07-18 ENCOUNTER — Other Ambulatory Visit: Payer: Self-pay | Admitting: *Deleted

## 2014-07-18 MED ORDER — LOVASTATIN 40 MG PO TABS: 40.0000 mg | ORAL_TABLET | Freq: Every day | ORAL | Status: DC

## 2014-07-18 NOTE — Telephone Encounter (Signed)
Fax from [facility], requesting 90 day supply

## 2014-09-05 ENCOUNTER — Other Ambulatory Visit: Payer: Self-pay | Admitting: Internal Medicine

## 2014-09-05 ENCOUNTER — Telehealth: Payer: Self-pay | Admitting: Internal Medicine

## 2014-09-05 NOTE — Telephone Encounter (Signed)
Last OV and refill 12.21.15.  please advise refill

## 2014-09-05 NOTE — Telephone Encounter (Signed)
Pt stopped by office to request rx for generic Ambien. Pt stated that her father ppassed away last Thursday and has not been sleeping. Please advise pt.msn

## 2014-09-05 NOTE — Telephone Encounter (Signed)
Duplicate message. 

## 2014-09-06 NOTE — Telephone Encounter (Signed)
30 day refill  ptrinted

## 2015-01-02 ENCOUNTER — Telehealth: Payer: Self-pay | Admitting: *Deleted

## 2015-01-02 NOTE — Telephone Encounter (Signed)
Patient has requested her medical records from this office to be faxed to [facility]. Fax # [phone] Thanks

## 2015-01-02 NOTE — Telephone Encounter (Signed)
Pt would need to come in to the office to fill out a medical release that will be sent to Medical Records.

## 2015-01-16 ENCOUNTER — Other Ambulatory Visit: Payer: Self-pay | Admitting: Internal Medicine

## 2015-04-12 ENCOUNTER — Other Ambulatory Visit: Payer: Self-pay | Admitting: Internal Medicine

## 2015-05-23 ENCOUNTER — Encounter: Payer: Self-pay | Admitting: *Deleted

## 2015-05-24 ENCOUNTER — Telehealth: Payer: Self-pay | Admitting: Internal Medicine

## 2015-05-24 NOTE — Telephone Encounter (Signed)
Left msg to call office to schedule AWV/msn °

## 2015-05-28 ENCOUNTER — Ambulatory Visit: Payer: Self-pay | Admitting: General Surgery

## 2015-05-30 ENCOUNTER — Other Ambulatory Visit: Payer: Self-pay | Admitting: General Surgery

## 2015-05-30 ENCOUNTER — Encounter: Payer: Self-pay | Admitting: General Surgery

## 2015-05-30 ENCOUNTER — Ambulatory Visit: Payer: Medicare Other

## 2015-05-30 ENCOUNTER — Ambulatory Visit (INDEPENDENT_AMBULATORY_CARE_PROVIDER_SITE_OTHER): Payer: Medicare Other | Admitting: General Surgery

## 2015-05-30 VITALS — BP 132/76 | HR 80 | Resp 12 | Ht 60.0 in | Wt 135.0 lb

## 2015-05-30 DIAGNOSIS — N6320 Unspecified lump in the left breast, unspecified quadrant: Secondary | ICD-10-CM

## 2015-05-30 DIAGNOSIS — R928 Other abnormal and inconclusive findings on diagnostic imaging of breast: Secondary | ICD-10-CM | POA: Diagnosis not present

## 2015-05-30 DIAGNOSIS — N63 Unspecified lump in breast: Secondary | ICD-10-CM | POA: Diagnosis not present

## 2015-05-30 NOTE — Patient Instructions (Addendum)
Stereotactic Breast Biopsy A stereotactic breast biopsy is a procedure in which mammography is used in the collection of a sample of breast tissue. Mammography is a type of X-ray exam of the breasts that produces an image called a mammogram. The mammogram allows your health care provider to precisely locate the area of the breast from which a tissue sample will be taken. The tissue is then examined under a microscope to see if cancerous cells are present. A breast biopsy is done when:   A lump, abnormality, or mass is seen in the breast on a breast X-ray (mammogram).   Small calcium deposits (calcifications) are seen in the breast.   The shape or appearance of the breasts changes.   The shape or appearance of the nipples changes. You may have unusual or bloody discharge coming from the nipples, or you may have crusting, retraction, or dimpling of the nipples. A breast biopsy can indicate if you need surgery or other treatment.  LET YOUR HEALTH CARE PROVIDER KNOW ABOUT:  Any allergies you have.  All medicines you are taking, including vitamins, herbs, eye drops, creams, and over-the-counter medicines.  Previous problems you or members of your family have had with the use of anesthetics.  Any blood disorders you have.  Previous surgeries you have had.  Medical conditions you have. RISKS AND COMPLICATIONS Generally, stereotactic breast biopsy is a safe procedure. However, as with any procedure, complications can occur. Possible complications include:  Infection at the needle-insertion site.   Bleeding or bruising after surgery.  The breast may become altered or deformed as a result of the procedure.  The needle may go through the chest wall into the lung area.  BEFORE THE PROCEDURE  Wear a supportive bra to the procedure.  You will be asked to remove jewelry, dentures, eyeglasses, metal objects, or clothing that might interfere with the X-ray images. You may want to leave  some of these objects at home.  Arrange for someone to drive you home after the procedure if desired. PROCEDURE  A stereotactic breast biopsy is done while you are awake. During the procedure, relax as much as possible. Let your health care provider know if you are uncomfortable, anxious, or in pain. Usually, the only discomfort felt during the procedure is caused by staying in one position for the length of the procedure. This discomfort can be reduced by carefully placed cushions. Most of the time the biopsy is done using a table with openings on it. You will be asked to lie facedown on the table and place your breasts through the openings. Your breast is compressed between metal plates to get good X-ray images. Your skin will be cleaned, and a numbing medicine (local anesthetic) will be injected. A small cut (incision) will be made in your breast. The tip of the biopsy needle will be directed through the incision. Several small pieces of suspicious tissue will be taken. Then, a final set of X-ray images will be obtained. If they show that the suspicious tissue has been mostly or completely removed, a small clip will be left at the biopsy site. This is done so that the biopsy site can be easily located if the results of the biopsy show that the tissue is cancerous.  After the procedure, the incision will be stitched (sutured) or taped and covered with a bandage (dressing). Your health care provider may apply a pressure dressing and an ice pack to prevent bleeding and swelling in the breast.  A stereotactic   breast biopsy can take 30 minutes or more. AFTER THE PROCEDURE  If you are doing well and have no problems, you will be allowed to go home.    This information is not intended to replace advice given to you by your health care provider. Make sure you discuss any questions you have with your health care provider.   Document Released: 02/01/2003 Document Revised: 05/10/2013 Document Reviewed:  12/02/2012 Elsevier Interactive Patient Education [employer].  This patient has been scheduled for a left breast stereotactic biopsy at [facility] for 06-18-15 at 1 pm. She will check-in at the [facility] at 12:30 pm. This patient is aware of date, time, and instructions. Patient verbalizes understanding.

## 2015-05-30 NOTE — Progress Notes (Signed)
Patient ID: Nicole York, female   DOB: 1950-12-28, 65 y.o.   MRN: [id number]  Chief Complaint  Patient presents with  . Other    mammogram    HPI Nicole York is a 65 y.o. female who presents for a breast evaluation. The most recent mammogram was done on 04/23/15 and added views 05/22/15.  Patient does perform regular self breast checks and gets regular mammograms done.  Mother breast cancer age 11.  I personally reviewed the patient's history. HPI  Past Medical History  Diagnosis Date  . History of chicken pox   . Allergy   . Hyperlipidemia     Past Surgical History  Procedure Laterality Date  . Tonsillectomy    . Tubal ligation    . Breast biopsy Right 1989    Family History  Problem Relation Age of Onset  . Breast cancer Mother   . Hyperlipidemia Mother   . Cancer Mother 67    breast  . Mental illness Mother     Alzheimers Dementia  . Hyperlipidemia Father   . Hypertension Father     Social History Social History  Substance Use Topics  . Smoking status: Former Smoker -- 0.30 packs/day    Types: Cigarettes    Quit date: 05/04/2014  . Smokeless tobacco: None  . Alcohol Use: 0.0 oz/week    0 Standard drinks or equivalent per week     Comment: socially    No Known Allergies  Current Outpatient Prescriptions  Medication Sig Dispense Refill  . alendronate (FOSAMAX) 70 MG tablet TAKE 1 TABLET (70 MG TOTAL) BY MOUTH EVERY 7 (SEVEN) DAYS.  5  . aspirin 325 MG tablet Take 325 mg by mouth daily.      . cetirizine (ZYRTEC) 10 MG tablet Take 10 mg by mouth daily.      . cholecalciferol (VITAMIN D) 1000 UNITS tablet Take 1,000 Units by mouth daily.      Marland Kitchen escitalopram (LEXAPRO) 5 MG tablet Take 1 tablet (5 mg total) by mouth at bedtime. 30 tablet 0  . fish oil-omega-3 fatty acids 1000 MG capsule Take 2 g by mouth daily.      . fluticasone (FLONASE) 50 MCG/ACT nasal spray USE 2 SPRAY IN EACH NOSTRIL EVERY DAY 16 g 0  . lovastatin (MEVACOR) 40 MG tablet TAKE 1  TABLET AT BEDTIME 90 tablet 0  . MULTIPLE VITAMIN PO Take by mouth.      . vitamin E 400 UNIT capsule Take 400 Units by mouth daily.      Marland Kitchen zolpidem (AMBIEN) 10 MG tablet Take 1 tablet (10 mg total) by mouth at bedtime as needed for sleep. 30 tablet 0   No current facility-administered medications for this visit.    Review of Systems Review of Systems  Constitutional: Negative.   Respiratory: Negative.   Cardiovascular: Negative.     Blood pressure 132/76, pulse 80, resp. rate 12, height 5' (1.524 m), weight 135 lb (61.236 kg).  Physical Exam Physical Exam  Constitutional: She is oriented to person, place, and time. She appears well-developed and well-nourished.  Eyes: Conjunctivae are normal. No scleral icterus.  Neck: Neck supple.  Cardiovascular: Normal rate, regular rhythm and normal heart sounds.   Pulmonary/Chest: Effort normal and breath sounds normal. Right breast exhibits no inverted nipple, no mass, no nipple discharge, no skin change and no tenderness. Left breast exhibits no inverted nipple, no nipple discharge, no skin change and no tenderness. Breasts are asymmetrical (right bigger than left.).  Abdominal: Soft. Bowel sounds are normal. There is no tenderness.  Lymphadenopathy:    She has no cervical adenopathy.    She has no axillary adenopathy.  Neurological: She is alert and oriented to person, place, and time.  Skin: Skin is warm and dry.    Data Reviewed [facility] mammograms dated 04/23/2015 and focal spot compression views dated 05/22/2015 were reviewed. Focal area of architectural distortion with a 6 mm spiculated area in the central breast at 3-4:00 position for which stereotactic biopsy was recommended. BI-RADS-4.  Ultrasound examination of the left breast was undertaken. No clearly reproducible area of architectural distortion to correspond to the mammographic images were identified. BI-RADS-0. No images.  Assessment    Abnormal left breast  mammogram, interval change over the last one-2 years.    Plan    Indication for stereotactic biopsy was reviewed. The procedure was discussed in detail. Risks associated with the procedure reviewed.    Patient to have a left breast stereo.  This patient has been scheduled for a left breast stereotactic biopsy at [facility] for 06-18-15 at 1 pm. She will check-in at the [facility] at 12:30 pm. This patient is aware of date, time, and instructions. Patient verbalizes understanding.   PCP:  [place]  This information has been scribed by Gaspar Cola CMA.   Robert Bellow 05/30/2015, 7:44 PM

## 2015-06-18 ENCOUNTER — Ambulatory Visit
Admission: RE | Admit: 2015-06-18 | Discharge: 2015-06-18 | Disposition: A | Discharge status: Home / Self Care | Payer: Medicare Other | Source: Ambulatory Visit | Attending: General Surgery | Admitting: General Surgery

## 2015-06-18 DIAGNOSIS — N6032 Fibrosclerosis of left breast: Secondary | ICD-10-CM | POA: Diagnosis not present

## 2015-06-18 DIAGNOSIS — N63 Unspecified lump in breast: Secondary | ICD-10-CM | POA: Insufficient documentation

## 2015-06-18 DIAGNOSIS — N6320 Unspecified lump in the left breast, unspecified quadrant: Secondary | ICD-10-CM

## 2015-06-18 DIAGNOSIS — R928 Other abnormal and inconclusive findings on diagnostic imaging of breast: Secondary | ICD-10-CM | POA: Diagnosis present

## 2015-06-18 DIAGNOSIS — R920 Mammographic microcalcification found on diagnostic imaging of breast: Secondary | ICD-10-CM | POA: Diagnosis not present

## 2015-06-18 HISTORY — PX: BREAST BIOPSY: SHX20

## 2015-06-19 ENCOUNTER — Telehealth: Payer: Self-pay | Admitting: General Surgery

## 2015-06-19 LAB — SURGICAL PATHOLOGY

## 2015-06-19 NOTE — Telephone Encounter (Signed)
The patient was noted that pathology was benign. She'll be contacted next week by the nurse to confirm she is having no difficulties with the biopsy site. She will be notified of Nicole York for future follow-up.

## 2015-06-25 ENCOUNTER — Ambulatory Visit: Payer: Medicare Other

## 2015-06-26 ENCOUNTER — Telehealth: Payer: Self-pay | Admitting: *Deleted

## 2015-06-26 NOTE — Telephone Encounter (Signed)
-----   Message from Carson Myrtle, RN sent at 06/20/2015  8:28 AM EST ----- Check on patient post biopsy.

## 2015-06-26 NOTE — Telephone Encounter (Signed)
I called to check on the patient and she states she is doing well. No bruises and no discomfort. She states the steri strips are still in place. Continue self breast exams. Call office for any new breast issues or concerns. Follow up as scheduled, pt agrees.

## 2015-07-18 ENCOUNTER — Other Ambulatory Visit: Payer: Self-pay | Admitting: Internal Medicine

## 2015-09-10 ENCOUNTER — Other Ambulatory Visit
Admission: RE | Admit: 2015-09-10 | Discharge: 2015-09-10 | Disposition: A | Discharge status: Home / Self Care | Payer: Medicare Other | Source: Ambulatory Visit | Attending: Gastroenterology | Admitting: Gastroenterology

## 2015-09-10 DIAGNOSIS — R197 Diarrhea, unspecified: Secondary | ICD-10-CM | POA: Insufficient documentation

## 2015-09-10 LAB — GASTROINTESTINAL PANEL BY PCR, STOOL (REPLACES STOOL CULTURE)
Adenovirus F40/41: NOT DETECTED
Astrovirus: NOT DETECTED
CRYPTOSPORIDIUM: NOT DETECTED
Campylobacter species: NOT DETECTED
Cyclospora cayetanensis: NOT DETECTED
E. COLI O157: NOT DETECTED
ENTAMOEBA HISTOLYTICA: NOT DETECTED
ENTEROAGGREGATIVE E COLI (EAEC): NOT DETECTED
Enteropathogenic E coli (EPEC): NOT DETECTED
Enterotoxigenic E coli (ETEC): NOT DETECTED
GIARDIA LAMBLIA: NOT DETECTED
Norovirus GI/GII: NOT DETECTED
PLESIMONAS SHIGELLOIDES: NOT DETECTED
Rotavirus A: NOT DETECTED
SALMONELLA SPECIES: NOT DETECTED
SAPOVIRUS (I, II, IV, AND V): NOT DETECTED
SHIGELLA/ENTEROINVASIVE E COLI (EIEC): NOT DETECTED
Shiga like toxin producing E coli (STEC): NOT DETECTED
VIBRIO CHOLERAE: NOT DETECTED
Vibrio species: NOT DETECTED
YERSINIA ENTEROCOLITICA: NOT DETECTED

## 2015-09-10 LAB — C DIFFICILE QUICK SCREEN W PCR REFLEX
C Diff antigen: NEGATIVE
C Diff interpretation: NEGATIVE
C Diff toxin: NEGATIVE

## 2015-09-18 ENCOUNTER — Ambulatory Visit
Admission: RE | Admit: 2015-09-18 | Discharge: 2015-09-18 | Disposition: A | Discharge status: Home / Self Care | Payer: Medicare Other | Source: Ambulatory Visit | Attending: Gastroenterology | Admitting: Gastroenterology

## 2015-09-18 ENCOUNTER — Ambulatory Visit: Payer: Medicare Other | Admitting: *Deleted

## 2015-09-18 ENCOUNTER — Encounter
Admission: RE | Disposition: A | Discharge status: Home / Self Care | Payer: Self-pay | Source: Ambulatory Visit | Attending: Gastroenterology

## 2015-09-18 ENCOUNTER — Encounter: Payer: Self-pay | Admitting: *Deleted

## 2015-09-18 DIAGNOSIS — M8580 Other specified disorders of bone density and structure, unspecified site: Secondary | ICD-10-CM | POA: Diagnosis not present

## 2015-09-18 DIAGNOSIS — D123 Benign neoplasm of transverse colon: Secondary | ICD-10-CM | POA: Diagnosis not present

## 2015-09-18 DIAGNOSIS — Z7982 Long term (current) use of aspirin: Secondary | ICD-10-CM | POA: Insufficient documentation

## 2015-09-18 DIAGNOSIS — G4700 Insomnia, unspecified: Secondary | ICD-10-CM | POA: Diagnosis not present

## 2015-09-18 DIAGNOSIS — K5730 Diverticulosis of large intestine without perforation or abscess without bleeding: Secondary | ICD-10-CM | POA: Diagnosis not present

## 2015-09-18 DIAGNOSIS — K580 Irritable bowel syndrome with diarrhea: Secondary | ICD-10-CM | POA: Diagnosis not present

## 2015-09-18 DIAGNOSIS — F17200 Nicotine dependence, unspecified, uncomplicated: Secondary | ICD-10-CM | POA: Diagnosis not present

## 2015-09-18 DIAGNOSIS — E785 Hyperlipidemia, unspecified: Secondary | ICD-10-CM | POA: Insufficient documentation

## 2015-09-18 DIAGNOSIS — Z79899 Other long term (current) drug therapy: Secondary | ICD-10-CM | POA: Insufficient documentation

## 2015-09-18 DIAGNOSIS — Q438 Other specified congenital malformations of intestine: Secondary | ICD-10-CM | POA: Insufficient documentation

## 2015-09-18 DIAGNOSIS — R197 Diarrhea, unspecified: Secondary | ICD-10-CM | POA: Diagnosis present

## 2015-09-18 HISTORY — DX: Major depressive disorder, single episode, unspecified: F32.9

## 2015-09-18 HISTORY — PX: COLONOSCOPY WITH PROPOFOL: SHX5780

## 2015-09-18 HISTORY — DX: Depression, unspecified: F32.A

## 2015-09-18 SURGERY — COLONOSCOPY WITH PROPOFOL
Anesthesia: General

## 2015-09-18 MED ORDER — PROPOFOL 10 MG/ML IV BOLUS
INTRAVENOUS | Status: DC | PRN
  Administered 2015-09-18 (×3): 50 mg via INTRAVENOUS

## 2015-09-18 MED ORDER — PHENYLEPHRINE HCL 10 MG/ML IJ SOLN
INTRAMUSCULAR | Status: DC | PRN
  Administered 2015-09-18 (×2): 50 ug via INTRAVENOUS

## 2015-09-18 MED ORDER — SODIUM CHLORIDE 0.9 % IV SOLN: INTRAVENOUS | Status: DC

## 2015-09-18 MED ORDER — PROPOFOL 500 MG/50ML IV EMUL
INTRAVENOUS | Status: DC | PRN
Start: 2015-09-18 — End: 2015-09-18
  Administered 2015-09-18: 50 ug/kg/min via INTRAVENOUS

## 2015-09-18 MED ORDER — SODIUM CHLORIDE 0.9 % IV SOLN
INTRAVENOUS | Status: DC
  Administered 2015-09-18 (×2): via INTRAVENOUS

## 2015-09-18 NOTE — Op Note (Signed)
[facility] Patient Name: Nicole York Procedure Date: 09/18/2015 11:24 AM MRN: [id number] Account #: [id number] Date of Birth: 05/01/1951 Admit Type: Outpatient Age: 65 Room: [facility] ENDO ROOM 3 Gender: Female Note Status: Finalized Procedure:            Colonoscopy Indications:          Clinically significant diarrhea of unexplained origin,                        Change in bowel habits Providers:            Lollie Sails, MD Referring MD:         Dion Body (Referring MD) Medicines:            Monitored Anesthesia Care Complications:        No immediate complications. Procedure:            Pre-Anesthesia Assessment:                       - ASA Grade Assessment: II - A patient with mild                        systemic disease.                       After obtaining informed consent, the colonoscope was                        passed under direct vision. Throughout the procedure,                        the patient's blood pressure, pulse, and oxygen                        saturations were monitored continuously. The                        Colonoscope was introduced through the anus and                        advanced to the the cecum, identified by appendiceal                        orifice and ileocecal valve. The colonoscopy was                        extremely difficult due to a tortuous colon. Successful                        completion of the procedure was aided by changing the                        patient to a supine position, changing the patient to a                        prone position, using manual pressure, withdrawing and                        reinserting the scope and changing endoscopes. The  patient tolerated the procedure well. The quality of                        the bowel preparation was good. Findings:      The sigmoid colon, descending colon and transverse colon were       significantly  redundant.      A few small-mouthed diverticula were found in the sigmoid colon and       descending colon.      A 3 mm polyp was found in the proximal transverse colon. The polyp was       sessile. The polyp was removed with a cold biopsy forceps. Resection and       retrieval were complete.      The exam was otherwise normal throughout the examined colon.      The digital rectal exam was normal.      Biopsies for histology were taken with a cold forceps from the right       colon and left colon for evaluation of microscopic colitis. Impression:           - Redundant colon.                       - Diverticulosis in the sigmoid colon and in the                        descending colon.                       - One 3 mm polyp in the proximal transverse colon,                        removed with a cold biopsy forceps. Resected and                        retrieved.                       - Biopsies were taken with a cold forceps from the                        right colon and left colon for evaluation of                        microscopic colitis. Recommendation:       - Discharge patient to home.                       - Use Citrucel one tablespoon PO daily daily.                       - Imodium 1 tablet PO daily.                       - Return to GI clinic in 1 month. Procedure Code(s):    --- Professional ---                       [phone], Colonoscopy, flexible; with biopsy, single or                        multiple Diagnosis Code(s):    --- Professional ---  D12.3, Benign neoplasm of transverse colon (hepatic                        flexure or splenic flexure)                       R19.7, Diarrhea, unspecified                       R19.4, Change in bowel habit                       K57.30, Diverticulosis of large intestine without                        perforation or abscess without bleeding                       Q43.8, Other specified congenital malformations of                         intestine CPT copyright 2016 American Medical Association. All rights reserved. The codes documented in this report are preliminary and upon coder review may  be revised to meet current compliance requirements. Lollie Sails, MD 09/18/2015 12:26:28 PM This report has been signed electronically. Number of Addenda: 0 Note Initiated On: 09/18/2015 11:24 AM Scope Withdrawal Time: 0 hours 9 minutes 14 seconds  Total Procedure Duration: 0 hours 47 minutes 38 seconds       [facility]

## 2015-09-18 NOTE — H&P (Signed)
Outpatient short stay form Pre-procedure 09/18/2015 11:24 AM Lollie Sails MD  Primary Physician: Dr Dion Body  Reason for visit:  Colonoscopy  History of present illness:  Patient is a 65 year old female presenting today for colonoscopy in regards to recent problems of bowel habit change fecal incontinence and diarrhea. She does have a history of irritable bowel syndrome however the fecal incontinence is new. This been a problem for at least several weeks. He has not taken any new or different medications. She also has been getting some lower abdominal discomfort. Stool studies were negative. She had a question whether she may be lactose intolerant.    Current facility-administered medications:  .  0.9 %  sodium chloride infusion, , Intravenous, Continuous, Lollie Sails, MD, Last Rate: 20 mL/hr at 09/18/15 1057 .  0.9 %  sodium chloride infusion, , Intravenous, Continuous, Lollie Sails, MD  Prescriptions prior to admission  Medication Sig Dispense Refill Last Dose  . aspirin 325 MG tablet Take 325 mg by mouth daily.     Past Week at Unknown time  . escitalopram (LEXAPRO) 5 MG tablet Take 1 tablet (5 mg total) by mouth at bedtime. 30 tablet 0 Past Week at Unknown time  . fish oil-omega-3 fatty acids 1000 MG capsule Take 2 g by mouth daily.     Past Week at Unknown time  . MULTIPLE VITAMIN PO Take by mouth.     Past Week at Unknown time  . alendronate (FOSAMAX) 70 MG tablet TAKE 1 TABLET (70 MG TOTAL) BY MOUTH EVERY 7 (SEVEN) DAYS.  5 Taking  . cetirizine (ZYRTEC) 10 MG tablet Take 10 mg by mouth daily.     Taking  . cholecalciferol (VITAMIN D) 1000 UNITS tablet Take 1,000 Units by mouth daily.     Taking  . fluticasone (FLONASE) 50 MCG/ACT nasal spray USE 2 SPRAY IN EACH NOSTRIL EVERY DAY 16 g 0 Taking  . lovastatin (MEVACOR) 40 MG tablet TAKE 1 TABLET AT BEDTIME 90 tablet 0 Taking  . vitamin E 400 UNIT capsule Take 400 Units by mouth daily.     Taking  . zolpidem  (AMBIEN) 10 MG tablet Take 1 tablet (10 mg total) by mouth at bedtime as needed for sleep. 30 tablet 0 Taking     No Known Allergies   Past Medical History  Diagnosis Date  . History of chicken pox   . Allergy   . Hyperlipidemia   . Depression     Review of systems:      Physical Exam    Heart and lungs: Regular rate and rhythm without rub or gallop, lungs are bilaterally clear.    HEENT: Normocephalic atraumatic eyes are anicteric    Other:     Pertinant exam for procedure: Soft nontender nondistended bowel sounds positive normoactive.    Planned proceedures: Colonoscopy and indicated procedures. I have discussed the risks benefits and complications of procedures to include not limited to bleeding, infection, perforation and the risk of sedation and the patient wishes to proceed.    Lollie Sails, MD Gastroenterology 09/18/2015  11:24 AM

## 2015-09-18 NOTE — Anesthesia Postprocedure Evaluation (Signed)
Anesthesia Post Note  Patient: Nicole York  Procedure(s) Performed: Procedure(s) (LRB): COLONOSCOPY WITH PROPOFOL (N/A)  Patient location during evaluation: PACU Anesthesia Type: General Level of consciousness: awake Pain management: pain level controlled Vital Signs Assessment: post-procedure vital signs reviewed and stable Respiratory status: spontaneous breathing Cardiovascular status: blood pressure returned to baseline Anesthetic complications: no    Last Vitals:  Filed Vitals:   09/18/15 1240 09/18/15 1250  BP: 113/47 122/68  Pulse: 58 60  Temp:    Resp: 17 17    Last Pain: There were no vitals filed for this visit.               VAN STAVEREN,Shamell Hittle

## 2015-09-18 NOTE — Anesthesia Preprocedure Evaluation (Signed)
Anesthesia Evaluation  Patient identified by MRN, date of birth, ID band Patient awake    Airway Mallampati: II       Dental  (+) Teeth Intact   Pulmonary neg pulmonary ROS, former smoker,    breath sounds clear to auscultation       Cardiovascular negative cardio ROS   Rhythm:Regular     Neuro/Psych    GI/Hepatic negative GI ROS, Neg liver ROS,   Endo/Other  negative endocrine ROS  Renal/GU negative Renal ROS  negative genitourinary   Musculoskeletal negative musculoskeletal ROS (+)   Abdominal Normal abdominal exam  (+)   Peds negative pediatric ROS (+)  Hematology negative hematology ROS (+)   Anesthesia Other Findings   Reproductive/Obstetrics                             Anesthesia Physical Anesthesia Plan  ASA: II  Anesthesia Plan: General   Post-op Pain Management:    Induction: Intravenous  Airway Management Planned: Natural Airway and Nasal Cannula  Additional Equipment:   Intra-op Plan:   Post-operative Plan:   Informed Consent: I have reviewed the patients History and Physical, chart, labs and discussed the procedure including the risks, benefits and alternatives for the proposed anesthesia with the patient or authorized representative who has indicated his/her understanding and acceptance.     Plan Discussed with: CRNA  Anesthesia Plan Comments:         Anesthesia Quick Evaluation

## 2015-09-18 NOTE — Transfer of Care (Signed)
Immediate Anesthesia Transfer of Care Note  Patient: Nicole York  Procedure(s) Performed: Procedure(s): COLONOSCOPY WITH PROPOFOL (N/A)  Patient Location: PACU  Anesthesia Type:General  Level of Consciousness: awake, alert  and oriented  Airway & Oxygen Therapy: Patient Spontanous Breathing and Patient connected to nasal cannula oxygen  Post-op Assessment: Report given to RN and Post -op Vital signs reviewed and unstable, Anesthesiologist notified  Post vital signs: Reviewed and stable  Last Vitals:  Filed Vitals:   09/18/15 1041 09/18/15 1230  BP: 119/73 108/50  Pulse: 72 64  Temp: 35.6 C 36 C  Resp: 18 16    Last Pain: There were no vitals filed for this visit.       Complications: No apparent anesthesia complications

## 2015-09-19 LAB — SURGICAL PATHOLOGY

## 2015-09-21 ENCOUNTER — Encounter: Payer: Self-pay | Admitting: Gastroenterology

## 2016-04-24 ENCOUNTER — Encounter: Payer: Self-pay | Admitting: General Surgery

## 2016-04-28 ENCOUNTER — Encounter: Payer: Self-pay | Admitting: *Deleted

## 2016-05-01 ENCOUNTER — Encounter: Payer: Self-pay | Admitting: General Surgery

## 2016-05-01 ENCOUNTER — Ambulatory Visit (INDEPENDENT_AMBULATORY_CARE_PROVIDER_SITE_OTHER): Payer: Medicare Other | Admitting: General Surgery

## 2016-05-01 VITALS — BP 140/72 | HR 70 | Resp 12 | Ht 61.5 in | Wt 137.0 lb

## 2016-05-01 DIAGNOSIS — R928 Other abnormal and inconclusive findings on diagnostic imaging of breast: Secondary | ICD-10-CM | POA: Diagnosis not present

## 2016-05-01 DIAGNOSIS — H01139 Eczematous dermatitis of unspecified eye, unspecified eyelid: Secondary | ICD-10-CM

## 2016-05-01 DIAGNOSIS — N6320 Unspecified lump in the left breast, unspecified quadrant: Secondary | ICD-10-CM

## 2016-05-01 NOTE — Progress Notes (Signed)
Patient ID: Nicole York, female   DOB: 24-Sep-1950, 65 y.o.   MRN: [id number]  Chief Complaint  Patient presents with  . Follow-up    HPI Nicole York is a 65 y.o. female.  who presents for a breast evaluation. The most recent mammogram was done on 04-22-16.  Patient does perform regular self breast checks and gets regular mammograms done.  No new breast issues.  She has a facial rash with itching about every 4 weeks. She did throw out her old makeup, but this is not resolved the issue.   She has not been to a dermatologist as of yet.  HPI  Past Medical History:  Diagnosis Date  . Allergy   . Depression   . History of chicken pox   . Hyperlipidemia     Past Surgical History:  Procedure Laterality Date  . BREAST BIOPSY Right 1989  . BREAST BIOPSY Left 06/18/15   path pending, stereotactic biopsy  . COLONOSCOPY WITH PROPOFOL N/A 09/18/2015   Procedure: COLONOSCOPY WITH PROPOFOL;  Surgeon: Lollie Sails, MD;  [facility] ENDOSCOPY;  Service: Endoscopy;  Laterality: N/A;  . TONSILLECTOMY    . TUBAL LIGATION      Family History  Problem Relation Age of Onset  . Breast cancer Mother   . Hyperlipidemia Mother   . Cancer Mother 74    breast  . Mental illness Mother     Alzheimers Dementia  . Hyperlipidemia Father   . Hypertension Father     Social History Social History  Substance Use Topics  . Smoking status: Former Smoker    Packs/day: 0.30    Types: Cigarettes    Quit date: 05/04/2014  . Smokeless tobacco: Never Used  . Alcohol use 0.0 oz/week     Comment: socially    No Known Allergies  Current Outpatient Prescriptions  Medication Sig Dispense Refill  . alendronate (FOSAMAX) 70 MG tablet TAKE 1 TABLET (70 MG TOTAL) BY MOUTH EVERY 7 (SEVEN) DAYS.  5  . aspirin 325 MG tablet Take 325 mg by mouth daily.      . cetirizine (ZYRTEC) 10 MG tablet Take 10 mg by mouth daily.      . cholecalciferol (VITAMIN D) 1000 UNITS tablet Take 1,000 Units by mouth  daily.      Marland Kitchen escitalopram (LEXAPRO) 5 MG tablet Take 1 tablet (5 mg total) by mouth at bedtime. 30 tablet 0  . fish oil-omega-3 fatty acids 1000 MG capsule Take 2 g by mouth daily.      . fluticasone (FLONASE) 50 MCG/ACT nasal spray USE 2 SPRAY IN EACH NOSTRIL EVERY DAY 16 g 0  . lovastatin (MEVACOR) 40 MG tablet TAKE 1 TABLET AT BEDTIME 90 tablet 0  . Melatonin 10 MG TABS Take by mouth at bedtime.    . MULTIPLE VITAMIN PO Take by mouth.      . vitamin E 400 UNIT capsule Take 400 Units by mouth daily.       No current facility-administered medications for this visit.     Review of Systems Review of Systems  Constitutional: Negative.   Respiratory: Negative.   Cardiovascular: Negative.     Blood pressure 140/72, pulse 70, resp. rate 12, height 5' 1.5" (1.562 m), weight 137 lb (62.1 kg).  Physical Exam Physical Exam  Constitutional: She is oriented to person, place, and time. She appears well-developed and well-nourished.  HENT:  Mouth/Throat: Oropharynx is clear and moist.  Eyes: Conjunctivae are normal. No scleral icterus.  Neck: Neck supple.  Cardiovascular: Normal rate, regular rhythm and normal heart sounds.   Pulmonary/Chest: Effort normal and breath sounds normal. Right breast exhibits no inverted nipple, no mass, no nipple discharge, no skin change and no tenderness. Left breast exhibits no inverted nipple, no mass, no nipple discharge, no skin change and no tenderness.  Lymphadenopathy:    She has no cervical adenopathy.    She has no axillary adenopathy.  Neurological: She is alert and oriented to person, place, and time.  Skin: Skin is warm and dry.  Psychiatric: Her behavior is normal.    Data Reviewed Bilateral screening mammograms dated 04/22/2016 completed at [facility] were reviewed. Previous area of concern has been removed. BI-RADS-1.  Assessment    Left breast thickening secondary to fibroadenomatous change, resolved post biopsy.  Focal  dermatitis involving the eyelids, possibly related to makeup.    Plan    The patient is been asked to discontinue I makeup on her upper eyelids with twice a day application of hydrocortisone cream until she can be evaluated by dermatology.      Follow up as needed. Mammograms with PCP.  The patient is aware to call back for any questions or concerns. .  This information has been scribed by Karie Fetch RN, BSN,BC.   Nicole York 05/02/2016, 6:10 PM

## 2016-05-01 NOTE — Patient Instructions (Addendum)
The patient is aware to call back for any questions or concerns. Mammogram with PCP.

## 2016-05-02 DIAGNOSIS — H01139 Eczematous dermatitis of unspecified eye, unspecified eyelid: Secondary | ICD-10-CM | POA: Insufficient documentation

## 2017-05-06 ENCOUNTER — Other Ambulatory Visit: Payer: Self-pay | Admitting: Family Medicine

## 2017-05-06 DIAGNOSIS — Z1231 Encounter for screening mammogram for malignant neoplasm of breast: Secondary | ICD-10-CM

## 2017-05-08 ENCOUNTER — Ambulatory Visit
Admission: RE | Admit: 2017-05-08 | Discharge: 2017-05-08 | Disposition: A | Discharge status: Home / Self Care | Payer: Medicare Other | Source: Ambulatory Visit | Attending: Family Medicine | Admitting: Family Medicine

## 2017-05-08 DIAGNOSIS — Z1231 Encounter for screening mammogram for malignant neoplasm of breast: Secondary | ICD-10-CM

## 2017-05-15 ENCOUNTER — Inpatient Hospital Stay
Admission: RE | Admit: 2017-05-15 | Discharge: 2017-05-15 | Disposition: A | Discharge status: Home / Self Care | Payer: Self-pay | Source: Ambulatory Visit | Attending: *Deleted | Admitting: *Deleted

## 2017-05-15 ENCOUNTER — Other Ambulatory Visit: Payer: Self-pay | Admitting: *Deleted

## 2017-05-15 DIAGNOSIS — Z9289 Personal history of other medical treatment: Secondary | ICD-10-CM

## 2017-07-26 DIAGNOSIS — G8911 Acute pain due to trauma: Secondary | ICD-10-CM | POA: Diagnosis not present

## 2017-08-03 DIAGNOSIS — H01009 Unspecified blepharitis unspecified eye, unspecified eyelid: Secondary | ICD-10-CM | POA: Diagnosis not present

## 2017-08-03 DIAGNOSIS — H1089 Other conjunctivitis: Secondary | ICD-10-CM | POA: Diagnosis not present

## 2017-08-05 DIAGNOSIS — M25561 Pain in right knee: Secondary | ICD-10-CM | POA: Diagnosis not present

## 2017-08-06 ENCOUNTER — Other Ambulatory Visit: Payer: Self-pay | Admitting: Family Medicine

## 2017-08-06 DIAGNOSIS — M25561 Pain in right knee: Secondary | ICD-10-CM

## 2017-08-13 ENCOUNTER — Ambulatory Visit
Admission: RE | Admit: 2017-08-13 | Discharge: 2017-08-13 | Disposition: A | Discharge status: Home / Self Care | Payer: PPO | Source: Ambulatory Visit | Attending: Family Medicine | Admitting: Family Medicine

## 2017-08-13 DIAGNOSIS — M25561 Pain in right knee: Secondary | ICD-10-CM

## 2017-08-13 DIAGNOSIS — M7121 Synovial cyst of popliteal space [Baker], right knee: Secondary | ICD-10-CM | POA: Insufficient documentation

## 2017-08-13 DIAGNOSIS — S83241A Other tear of medial meniscus, current injury, right knee, initial encounter: Secondary | ICD-10-CM | POA: Insufficient documentation

## 2017-08-13 DIAGNOSIS — X58XXXA Exposure to other specified factors, initial encounter: Secondary | ICD-10-CM | POA: Insufficient documentation

## 2017-08-13 DIAGNOSIS — M25461 Effusion, right knee: Secondary | ICD-10-CM | POA: Diagnosis not present

## 2017-08-13 DIAGNOSIS — M1711 Unilateral primary osteoarthritis, right knee: Secondary | ICD-10-CM | POA: Insufficient documentation

## 2017-08-19 DIAGNOSIS — M25561 Pain in right knee: Secondary | ICD-10-CM | POA: Diagnosis not present

## 2017-08-24 DIAGNOSIS — M25561 Pain in right knee: Secondary | ICD-10-CM | POA: Diagnosis not present

## 2017-08-27 DIAGNOSIS — M25561 Pain in right knee: Secondary | ICD-10-CM | POA: Diagnosis not present

## 2017-08-31 DIAGNOSIS — M25561 Pain in right knee: Secondary | ICD-10-CM | POA: Diagnosis not present

## 2017-09-02 DIAGNOSIS — M25561 Pain in right knee: Secondary | ICD-10-CM | POA: Diagnosis not present

## 2017-09-08 DIAGNOSIS — M25561 Pain in right knee: Secondary | ICD-10-CM | POA: Diagnosis not present

## 2017-09-10 DIAGNOSIS — M25561 Pain in right knee: Secondary | ICD-10-CM | POA: Diagnosis not present

## 2017-10-08 DIAGNOSIS — L239 Allergic contact dermatitis, unspecified cause: Secondary | ICD-10-CM | POA: Diagnosis not present

## 2017-10-08 DIAGNOSIS — S20461A Insect bite (nonvenomous) of right back wall of thorax, initial encounter: Secondary | ICD-10-CM | POA: Diagnosis not present

## 2017-10-21 DIAGNOSIS — M65341 Trigger finger, right ring finger: Secondary | ICD-10-CM | POA: Diagnosis not present

## 2017-10-21 DIAGNOSIS — S83241D Other tear of medial meniscus, current injury, right knee, subsequent encounter: Secondary | ICD-10-CM | POA: Diagnosis not present

## 2017-11-02 DIAGNOSIS — E7800 Pure hypercholesterolemia, unspecified: Secondary | ICD-10-CM | POA: Diagnosis not present

## 2017-11-02 DIAGNOSIS — R7303 Prediabetes: Secondary | ICD-10-CM | POA: Diagnosis not present

## 2017-11-03 DIAGNOSIS — H903 Sensorineural hearing loss, bilateral: Secondary | ICD-10-CM | POA: Diagnosis not present

## 2017-11-03 DIAGNOSIS — H6041 Cholesteatoma of right external ear: Secondary | ICD-10-CM | POA: Diagnosis not present

## 2017-11-05 DIAGNOSIS — S83241D Other tear of medial meniscus, current injury, right knee, subsequent encounter: Secondary | ICD-10-CM | POA: Diagnosis not present

## 2017-11-05 DIAGNOSIS — M65341 Trigger finger, right ring finger: Secondary | ICD-10-CM | POA: Diagnosis not present

## 2017-11-09 DIAGNOSIS — R7303 Prediabetes: Secondary | ICD-10-CM | POA: Diagnosis not present

## 2017-11-09 DIAGNOSIS — E782 Mixed hyperlipidemia: Secondary | ICD-10-CM | POA: Diagnosis not present

## 2017-11-09 DIAGNOSIS — F5101 Primary insomnia: Secondary | ICD-10-CM | POA: Diagnosis not present

## 2017-11-27 DIAGNOSIS — S83241D Other tear of medial meniscus, current injury, right knee, subsequent encounter: Secondary | ICD-10-CM | POA: Diagnosis not present

## 2017-11-27 DIAGNOSIS — M1711 Unilateral primary osteoarthritis, right knee: Secondary | ICD-10-CM | POA: Diagnosis not present

## 2017-11-27 DIAGNOSIS — M2391 Unspecified internal derangement of right knee: Secondary | ICD-10-CM | POA: Diagnosis not present

## 2017-12-08 DIAGNOSIS — M25561 Pain in right knee: Secondary | ICD-10-CM | POA: Diagnosis not present

## 2017-12-10 DIAGNOSIS — L821 Other seborrheic keratosis: Secondary | ICD-10-CM | POA: Diagnosis not present

## 2017-12-10 DIAGNOSIS — I781 Nevus, non-neoplastic: Secondary | ICD-10-CM | POA: Diagnosis not present

## 2017-12-15 DIAGNOSIS — M25561 Pain in right knee: Secondary | ICD-10-CM | POA: Diagnosis not present

## 2017-12-22 DIAGNOSIS — M25561 Pain in right knee: Secondary | ICD-10-CM | POA: Diagnosis not present

## 2017-12-29 DIAGNOSIS — M25561 Pain in right knee: Secondary | ICD-10-CM | POA: Diagnosis not present

## 2018-02-03 DIAGNOSIS — M25561 Pain in right knee: Secondary | ICD-10-CM | POA: Diagnosis not present

## 2018-03-02 DIAGNOSIS — R7303 Prediabetes: Secondary | ICD-10-CM | POA: Diagnosis not present

## 2018-03-02 DIAGNOSIS — E782 Mixed hyperlipidemia: Secondary | ICD-10-CM | POA: Diagnosis not present

## 2018-03-16 DIAGNOSIS — Z0000 Encounter for general adult medical examination without abnormal findings: Secondary | ICD-10-CM | POA: Diagnosis not present

## 2018-03-16 DIAGNOSIS — M2391 Unspecified internal derangement of right knee: Secondary | ICD-10-CM | POA: Diagnosis not present

## 2018-03-16 DIAGNOSIS — E7800 Pure hypercholesterolemia, unspecified: Secondary | ICD-10-CM | POA: Diagnosis not present

## 2018-03-16 DIAGNOSIS — R7303 Prediabetes: Secondary | ICD-10-CM | POA: Diagnosis not present

## 2018-03-30 DIAGNOSIS — S83241D Other tear of medial meniscus, current injury, right knee, subsequent encounter: Secondary | ICD-10-CM | POA: Diagnosis not present

## 2018-03-30 DIAGNOSIS — M1711 Unilateral primary osteoarthritis, right knee: Secondary | ICD-10-CM | POA: Diagnosis not present

## 2018-04-22 DIAGNOSIS — M1711 Unilateral primary osteoarthritis, right knee: Secondary | ICD-10-CM | POA: Diagnosis not present

## 2018-04-29 DIAGNOSIS — M1711 Unilateral primary osteoarthritis, right knee: Secondary | ICD-10-CM | POA: Diagnosis not present

## 2018-05-06 DIAGNOSIS — H6041 Cholesteatoma of right external ear: Secondary | ICD-10-CM | POA: Diagnosis not present

## 2018-05-06 DIAGNOSIS — M1711 Unilateral primary osteoarthritis, right knee: Secondary | ICD-10-CM | POA: Diagnosis not present

## 2018-05-06 DIAGNOSIS — H903 Sensorineural hearing loss, bilateral: Secondary | ICD-10-CM | POA: Diagnosis not present

## 2018-09-08 DIAGNOSIS — E7800 Pure hypercholesterolemia, unspecified: Secondary | ICD-10-CM | POA: Diagnosis not present

## 2018-09-08 DIAGNOSIS — R7303 Prediabetes: Secondary | ICD-10-CM | POA: Diagnosis not present

## 2018-09-15 DIAGNOSIS — Z0000 Encounter for general adult medical examination without abnormal findings: Secondary | ICD-10-CM | POA: Diagnosis not present

## 2018-09-15 DIAGNOSIS — E7800 Pure hypercholesterolemia, unspecified: Secondary | ICD-10-CM | POA: Diagnosis not present

## 2018-09-15 DIAGNOSIS — R7303 Prediabetes: Secondary | ICD-10-CM | POA: Diagnosis not present

## 2018-09-20 DIAGNOSIS — M65341 Trigger finger, right ring finger: Secondary | ICD-10-CM | POA: Diagnosis not present

## 2018-09-27 DIAGNOSIS — M65341 Trigger finger, right ring finger: Secondary | ICD-10-CM | POA: Diagnosis not present

## 2018-10-18 DIAGNOSIS — H25013 Cortical age-related cataract, bilateral: Secondary | ICD-10-CM | POA: Diagnosis not present

## 2018-10-18 DIAGNOSIS — H02889 Meibomian gland dysfunction of unspecified eye, unspecified eyelid: Secondary | ICD-10-CM | POA: Diagnosis not present

## 2018-10-18 DIAGNOSIS — H16042 Marginal corneal ulcer, left eye: Secondary | ICD-10-CM | POA: Diagnosis not present

## 2018-10-25 DIAGNOSIS — H16041 Marginal corneal ulcer, right eye: Secondary | ICD-10-CM | POA: Diagnosis not present

## 2018-10-26 ENCOUNTER — Other Ambulatory Visit: Payer: Self-pay | Admitting: Family Medicine

## 2018-10-26 DIAGNOSIS — Z1231 Encounter for screening mammogram for malignant neoplasm of breast: Secondary | ICD-10-CM

## 2018-10-27 DIAGNOSIS — M2392 Unspecified internal derangement of left knee: Secondary | ICD-10-CM | POA: Diagnosis not present

## 2018-10-27 DIAGNOSIS — S8392XA Sprain of unspecified site of left knee, initial encounter: Secondary | ICD-10-CM | POA: Diagnosis not present

## 2018-10-27 DIAGNOSIS — M25562 Pain in left knee: Secondary | ICD-10-CM | POA: Diagnosis not present

## 2018-11-15 ENCOUNTER — Other Ambulatory Visit: Payer: Self-pay

## 2018-11-15 ENCOUNTER — Ambulatory Visit
Admission: RE | Admit: 2018-11-15 | Discharge: 2018-11-15 | Disposition: A | Discharge status: Home / Self Care | Payer: PPO | Source: Ambulatory Visit | Attending: Family Medicine | Admitting: Family Medicine

## 2018-11-15 DIAGNOSIS — Z1231 Encounter for screening mammogram for malignant neoplasm of breast: Secondary | ICD-10-CM | POA: Insufficient documentation

## 2018-11-15 DIAGNOSIS — H6041 Cholesteatoma of right external ear: Secondary | ICD-10-CM | POA: Diagnosis not present

## 2018-11-23 ENCOUNTER — Other Ambulatory Visit: Payer: Self-pay | Admitting: Family Medicine

## 2018-11-23 ENCOUNTER — Other Ambulatory Visit: Payer: Self-pay

## 2018-11-23 ENCOUNTER — Ambulatory Visit
Admission: RE | Admit: 2018-11-23 | Discharge: 2018-11-23 | Disposition: A | Discharge status: Home / Self Care | Payer: PPO | Source: Ambulatory Visit | Attending: Family Medicine | Admitting: Family Medicine

## 2018-11-23 DIAGNOSIS — R921 Mammographic calcification found on diagnostic imaging of breast: Secondary | ICD-10-CM

## 2018-11-23 DIAGNOSIS — R928 Other abnormal and inconclusive findings on diagnostic imaging of breast: Secondary | ICD-10-CM

## 2018-11-24 ENCOUNTER — Other Ambulatory Visit: Payer: Self-pay | Admitting: Sports Medicine

## 2018-11-24 DIAGNOSIS — M25562 Pain in left knee: Secondary | ICD-10-CM | POA: Diagnosis not present

## 2018-11-24 DIAGNOSIS — M25462 Effusion, left knee: Secondary | ICD-10-CM | POA: Diagnosis not present

## 2018-11-24 DIAGNOSIS — M2392 Unspecified internal derangement of left knee: Secondary | ICD-10-CM | POA: Diagnosis not present

## 2018-11-25 ENCOUNTER — Other Ambulatory Visit: Payer: Self-pay | Admitting: Family Medicine

## 2018-11-25 DIAGNOSIS — R928 Other abnormal and inconclusive findings on diagnostic imaging of breast: Secondary | ICD-10-CM

## 2018-11-25 DIAGNOSIS — R921 Mammographic calcification found on diagnostic imaging of breast: Secondary | ICD-10-CM

## 2018-11-30 ENCOUNTER — Ambulatory Visit
Admission: RE | Admit: 2018-11-30 | Discharge: 2018-11-30 | Disposition: A | Discharge status: Home / Self Care | Payer: PPO | Source: Ambulatory Visit | Attending: Family Medicine | Admitting: Family Medicine

## 2018-11-30 ENCOUNTER — Other Ambulatory Visit: Payer: Self-pay

## 2018-11-30 DIAGNOSIS — N6021 Fibroadenosis of right breast: Secondary | ICD-10-CM | POA: Diagnosis not present

## 2018-11-30 DIAGNOSIS — R928 Other abnormal and inconclusive findings on diagnostic imaging of breast: Secondary | ICD-10-CM

## 2018-11-30 DIAGNOSIS — R921 Mammographic calcification found on diagnostic imaging of breast: Secondary | ICD-10-CM

## 2018-11-30 DIAGNOSIS — R920 Mammographic microcalcification found on diagnostic imaging of breast: Secondary | ICD-10-CM | POA: Diagnosis not present

## 2018-11-30 HISTORY — PX: BREAST BIOPSY: SHX20

## 2018-12-01 LAB — SURGICAL PATHOLOGY

## 2018-12-14 ENCOUNTER — Other Ambulatory Visit: Payer: Self-pay

## 2018-12-14 ENCOUNTER — Ambulatory Visit
Admission: RE | Admit: 2018-12-14 | Discharge: 2018-12-14 | Disposition: A | Discharge status: Home / Self Care | Payer: PPO | Source: Ambulatory Visit | Attending: Sports Medicine | Admitting: Sports Medicine

## 2018-12-14 DIAGNOSIS — R600 Localized edema: Secondary | ICD-10-CM | POA: Diagnosis not present

## 2018-12-14 DIAGNOSIS — M2392 Unspecified internal derangement of left knee: Secondary | ICD-10-CM | POA: Diagnosis not present

## 2018-12-14 DIAGNOSIS — M19072 Primary osteoarthritis, left ankle and foot: Secondary | ICD-10-CM | POA: Diagnosis not present

## 2018-12-14 DIAGNOSIS — M25562 Pain in left knee: Secondary | ICD-10-CM | POA: Diagnosis not present

## 2018-12-14 DIAGNOSIS — M84362A Stress fracture, left tibia, initial encounter for fracture: Secondary | ICD-10-CM | POA: Diagnosis not present

## 2019-01-03 DIAGNOSIS — M1712 Unilateral primary osteoarthritis, left knee: Secondary | ICD-10-CM | POA: Diagnosis not present

## 2019-01-07 DIAGNOSIS — R7303 Prediabetes: Secondary | ICD-10-CM | POA: Diagnosis not present

## 2019-01-07 DIAGNOSIS — E7800 Pure hypercholesterolemia, unspecified: Secondary | ICD-10-CM | POA: Diagnosis not present

## 2019-01-12 DIAGNOSIS — M25562 Pain in left knee: Secondary | ICD-10-CM | POA: Diagnosis not present

## 2019-01-12 DIAGNOSIS — M25561 Pain in right knee: Secondary | ICD-10-CM | POA: Diagnosis not present

## 2019-01-12 DIAGNOSIS — G8929 Other chronic pain: Secondary | ICD-10-CM | POA: Diagnosis not present

## 2019-01-14 DIAGNOSIS — F5101 Primary insomnia: Secondary | ICD-10-CM | POA: Diagnosis not present

## 2019-01-14 DIAGNOSIS — R7303 Prediabetes: Secondary | ICD-10-CM | POA: Diagnosis not present

## 2019-01-14 DIAGNOSIS — E7800 Pure hypercholesterolemia, unspecified: Secondary | ICD-10-CM | POA: Diagnosis not present

## 2019-01-18 DIAGNOSIS — M170 Bilateral primary osteoarthritis of knee: Secondary | ICD-10-CM | POA: Diagnosis not present

## 2019-01-26 DIAGNOSIS — M170 Bilateral primary osteoarthritis of knee: Secondary | ICD-10-CM | POA: Diagnosis not present

## 2019-03-21 DIAGNOSIS — H6041 Cholesteatoma of right external ear: Secondary | ICD-10-CM | POA: Diagnosis not present

## 2019-06-03 DIAGNOSIS — H2513 Age-related nuclear cataract, bilateral: Secondary | ICD-10-CM | POA: Diagnosis not present

## 2019-06-03 DIAGNOSIS — H33321 Round hole, right eye: Secondary | ICD-10-CM | POA: Diagnosis not present

## 2019-06-03 DIAGNOSIS — H43811 Vitreous degeneration, right eye: Secondary | ICD-10-CM | POA: Diagnosis not present

## 2019-06-03 DIAGNOSIS — H33031 Retinal detachment with giant retinal tear, right eye: Secondary | ICD-10-CM | POA: Diagnosis not present

## 2019-06-03 DIAGNOSIS — H4311 Vitreous hemorrhage, right eye: Secondary | ICD-10-CM | POA: Diagnosis not present

## 2019-06-14 DIAGNOSIS — H4311 Vitreous hemorrhage, right eye: Secondary | ICD-10-CM | POA: Diagnosis not present

## 2019-06-14 DIAGNOSIS — H43811 Vitreous degeneration, right eye: Secondary | ICD-10-CM | POA: Diagnosis not present

## 2019-06-14 DIAGNOSIS — H33321 Round hole, right eye: Secondary | ICD-10-CM | POA: Diagnosis not present

## 2019-07-01 ENCOUNTER — Ambulatory Visit: Payer: PPO

## 2019-07-05 DIAGNOSIS — H33321 Round hole, right eye: Secondary | ICD-10-CM | POA: Diagnosis not present

## 2019-07-05 DIAGNOSIS — H4311 Vitreous hemorrhage, right eye: Secondary | ICD-10-CM | POA: Diagnosis not present

## 2019-07-11 DIAGNOSIS — E7800 Pure hypercholesterolemia, unspecified: Secondary | ICD-10-CM | POA: Diagnosis not present

## 2019-07-11 DIAGNOSIS — R7303 Prediabetes: Secondary | ICD-10-CM | POA: Diagnosis not present

## 2019-07-20 DIAGNOSIS — R7303 Prediabetes: Secondary | ICD-10-CM | POA: Diagnosis not present

## 2019-07-20 DIAGNOSIS — H6041 Cholesteatoma of right external ear: Secondary | ICD-10-CM | POA: Diagnosis not present

## 2019-07-20 DIAGNOSIS — E7800 Pure hypercholesterolemia, unspecified: Secondary | ICD-10-CM | POA: Diagnosis not present

## 2019-07-20 DIAGNOSIS — H60331 Swimmer's ear, right ear: Secondary | ICD-10-CM | POA: Diagnosis not present

## 2019-07-20 DIAGNOSIS — K589 Irritable bowel syndrome without diarrhea: Secondary | ICD-10-CM | POA: Diagnosis not present

## 2019-07-26 DIAGNOSIS — M170 Bilateral primary osteoarthritis of knee: Secondary | ICD-10-CM | POA: Diagnosis not present

## 2019-08-02 DIAGNOSIS — M1712 Unilateral primary osteoarthritis, left knee: Secondary | ICD-10-CM | POA: Diagnosis not present

## 2019-08-02 DIAGNOSIS — M1711 Unilateral primary osteoarthritis, right knee: Secondary | ICD-10-CM | POA: Diagnosis not present

## 2019-08-09 DIAGNOSIS — M170 Bilateral primary osteoarthritis of knee: Secondary | ICD-10-CM | POA: Diagnosis not present

## 2019-09-13 DIAGNOSIS — H2513 Age-related nuclear cataract, bilateral: Secondary | ICD-10-CM | POA: Diagnosis not present

## 2019-09-13 DIAGNOSIS — H31091 Other chorioretinal scars, right eye: Secondary | ICD-10-CM | POA: Diagnosis not present

## 2019-09-13 DIAGNOSIS — H4311 Vitreous hemorrhage, right eye: Secondary | ICD-10-CM | POA: Diagnosis not present

## 2019-09-13 DIAGNOSIS — H43813 Vitreous degeneration, bilateral: Secondary | ICD-10-CM | POA: Diagnosis not present

## 2019-11-30 DIAGNOSIS — H6041 Cholesteatoma of right external ear: Secondary | ICD-10-CM | POA: Diagnosis not present

## 2019-11-30 DIAGNOSIS — H60331 Swimmer's ear, right ear: Secondary | ICD-10-CM | POA: Diagnosis not present

## 2019-12-05 ENCOUNTER — Other Ambulatory Visit: Payer: Self-pay | Admitting: Family Medicine

## 2019-12-05 DIAGNOSIS — Z1231 Encounter for screening mammogram for malignant neoplasm of breast: Secondary | ICD-10-CM

## 2019-12-22 DIAGNOSIS — C44519 Basal cell carcinoma of skin of other part of trunk: Secondary | ICD-10-CM | POA: Diagnosis not present

## 2019-12-22 DIAGNOSIS — D485 Neoplasm of uncertain behavior of skin: Secondary | ICD-10-CM | POA: Diagnosis not present

## 2019-12-22 DIAGNOSIS — L570 Actinic keratosis: Secondary | ICD-10-CM | POA: Diagnosis not present

## 2019-12-22 DIAGNOSIS — D2271 Melanocytic nevi of right lower limb, including hip: Secondary | ICD-10-CM | POA: Diagnosis not present

## 2019-12-22 DIAGNOSIS — X32XXXA Exposure to sunlight, initial encounter: Secondary | ICD-10-CM | POA: Diagnosis not present

## 2019-12-22 DIAGNOSIS — D2262 Melanocytic nevi of left upper limb, including shoulder: Secondary | ICD-10-CM | POA: Diagnosis not present

## 2019-12-22 DIAGNOSIS — D2272 Melanocytic nevi of left lower limb, including hip: Secondary | ICD-10-CM | POA: Diagnosis not present

## 2019-12-22 DIAGNOSIS — D225 Melanocytic nevi of trunk: Secondary | ICD-10-CM | POA: Diagnosis not present

## 2019-12-22 DIAGNOSIS — L814 Other melanin hyperpigmentation: Secondary | ICD-10-CM | POA: Diagnosis not present

## 2019-12-22 DIAGNOSIS — D2261 Melanocytic nevi of right upper limb, including shoulder: Secondary | ICD-10-CM | POA: Diagnosis not present

## 2019-12-29 ENCOUNTER — Ambulatory Visit
Admission: RE | Admit: 2019-12-29 | Discharge: 2019-12-29 | Disposition: A | Discharge status: Home / Self Care | Payer: PPO | Source: Ambulatory Visit | Attending: Family Medicine | Admitting: Family Medicine

## 2019-12-29 ENCOUNTER — Other Ambulatory Visit: Payer: Self-pay

## 2019-12-29 DIAGNOSIS — Z1231 Encounter for screening mammogram for malignant neoplasm of breast: Secondary | ICD-10-CM | POA: Diagnosis not present

## 2020-01-05 DIAGNOSIS — C44519 Basal cell carcinoma of skin of other part of trunk: Secondary | ICD-10-CM | POA: Diagnosis not present

## 2020-01-11 ENCOUNTER — Other Ambulatory Visit: Payer: Self-pay | Admitting: Family Medicine

## 2020-01-11 DIAGNOSIS — N6459 Other signs and symptoms in breast: Secondary | ICD-10-CM

## 2020-01-11 DIAGNOSIS — N6489 Other specified disorders of breast: Secondary | ICD-10-CM

## 2020-01-17 ENCOUNTER — Ambulatory Visit
Admission: RE | Admit: 2020-01-17 | Discharge: 2020-01-17 | Disposition: A | Discharge status: Home / Self Care | Payer: PPO | Source: Ambulatory Visit | Attending: Family Medicine | Admitting: Family Medicine

## 2020-01-17 ENCOUNTER — Other Ambulatory Visit: Payer: Self-pay

## 2020-01-17 DIAGNOSIS — N6459 Other signs and symptoms in breast: Secondary | ICD-10-CM | POA: Diagnosis not present

## 2020-01-17 DIAGNOSIS — N6489 Other specified disorders of breast: Secondary | ICD-10-CM

## 2020-01-17 DIAGNOSIS — N6312 Unspecified lump in the right breast, upper inner quadrant: Secondary | ICD-10-CM | POA: Diagnosis not present

## 2020-01-17 DIAGNOSIS — R928 Other abnormal and inconclusive findings on diagnostic imaging of breast: Secondary | ICD-10-CM | POA: Diagnosis not present

## 2020-01-19 ENCOUNTER — Other Ambulatory Visit: Payer: Self-pay | Admitting: Family Medicine

## 2020-01-19 DIAGNOSIS — N6310 Unspecified lump in the right breast, unspecified quadrant: Secondary | ICD-10-CM

## 2020-01-19 DIAGNOSIS — R928 Other abnormal and inconclusive findings on diagnostic imaging of breast: Secondary | ICD-10-CM

## 2020-01-25 DIAGNOSIS — H40013 Open angle with borderline findings, low risk, bilateral: Secondary | ICD-10-CM | POA: Diagnosis not present

## 2020-01-25 DIAGNOSIS — H2512 Age-related nuclear cataract, left eye: Secondary | ICD-10-CM | POA: Diagnosis not present

## 2020-01-25 DIAGNOSIS — H25013 Cortical age-related cataract, bilateral: Secondary | ICD-10-CM | POA: Diagnosis not present

## 2020-01-25 DIAGNOSIS — H2589 Other age-related cataract: Secondary | ICD-10-CM | POA: Diagnosis not present

## 2020-01-25 DIAGNOSIS — H52213 Irregular astigmatism, bilateral: Secondary | ICD-10-CM | POA: Diagnosis not present

## 2020-01-25 DIAGNOSIS — H33301 Unspecified retinal break, right eye: Secondary | ICD-10-CM | POA: Diagnosis not present

## 2020-01-25 DIAGNOSIS — H2513 Age-related nuclear cataract, bilateral: Secondary | ICD-10-CM | POA: Diagnosis not present

## 2020-01-30 DIAGNOSIS — E7800 Pure hypercholesterolemia, unspecified: Secondary | ICD-10-CM | POA: Diagnosis not present

## 2020-01-30 DIAGNOSIS — R7303 Prediabetes: Secondary | ICD-10-CM | POA: Diagnosis not present

## 2020-02-01 DIAGNOSIS — F5101 Primary insomnia: Secondary | ICD-10-CM | POA: Diagnosis not present

## 2020-02-01 DIAGNOSIS — E7800 Pure hypercholesterolemia, unspecified: Secondary | ICD-10-CM | POA: Diagnosis not present

## 2020-02-01 DIAGNOSIS — Z0000 Encounter for general adult medical examination without abnormal findings: Secondary | ICD-10-CM | POA: Diagnosis not present

## 2020-02-01 DIAGNOSIS — Z23 Encounter for immunization: Secondary | ICD-10-CM | POA: Diagnosis not present

## 2020-02-07 DIAGNOSIS — H2512 Age-related nuclear cataract, left eye: Secondary | ICD-10-CM | POA: Diagnosis not present

## 2020-02-07 DIAGNOSIS — H25812 Combined forms of age-related cataract, left eye: Secondary | ICD-10-CM | POA: Diagnosis not present

## 2020-02-14 DIAGNOSIS — H2512 Age-related nuclear cataract, left eye: Secondary | ICD-10-CM | POA: Diagnosis not present

## 2020-02-24 DIAGNOSIS — M170 Bilateral primary osteoarthritis of knee: Secondary | ICD-10-CM | POA: Diagnosis not present

## 2020-02-27 DIAGNOSIS — H2589 Other age-related cataract: Secondary | ICD-10-CM | POA: Diagnosis not present

## 2020-02-27 DIAGNOSIS — H25011 Cortical age-related cataract, right eye: Secondary | ICD-10-CM | POA: Diagnosis not present

## 2020-02-27 DIAGNOSIS — H2511 Age-related nuclear cataract, right eye: Secondary | ICD-10-CM | POA: Diagnosis not present

## 2020-03-02 DIAGNOSIS — M170 Bilateral primary osteoarthritis of knee: Secondary | ICD-10-CM | POA: Diagnosis not present

## 2020-03-06 DIAGNOSIS — H2589 Other age-related cataract: Secondary | ICD-10-CM | POA: Diagnosis not present

## 2020-03-06 DIAGNOSIS — H2511 Age-related nuclear cataract, right eye: Secondary | ICD-10-CM | POA: Diagnosis not present

## 2020-03-06 DIAGNOSIS — H25011 Cortical age-related cataract, right eye: Secondary | ICD-10-CM | POA: Diagnosis not present

## 2020-03-06 DIAGNOSIS — H25811 Combined forms of age-related cataract, right eye: Secondary | ICD-10-CM | POA: Diagnosis not present

## 2020-03-09 DIAGNOSIS — M170 Bilateral primary osteoarthritis of knee: Secondary | ICD-10-CM | POA: Diagnosis not present

## 2020-03-13 DIAGNOSIS — H2511 Age-related nuclear cataract, right eye: Secondary | ICD-10-CM | POA: Diagnosis not present

## 2020-04-02 DIAGNOSIS — H6041 Cholesteatoma of right external ear: Secondary | ICD-10-CM | POA: Diagnosis not present

## 2020-04-02 DIAGNOSIS — H60339 Swimmer's ear, unspecified ear: Secondary | ICD-10-CM | POA: Diagnosis not present

## 2020-04-03 DIAGNOSIS — H43813 Vitreous degeneration, bilateral: Secondary | ICD-10-CM | POA: Diagnosis not present

## 2020-04-03 DIAGNOSIS — H31091 Other chorioretinal scars, right eye: Secondary | ICD-10-CM | POA: Diagnosis not present

## 2020-06-13 DIAGNOSIS — K1121 Acute sialoadenitis: Secondary | ICD-10-CM | POA: Diagnosis not present

## 2020-06-26 DIAGNOSIS — J301 Allergic rhinitis due to pollen: Secondary | ICD-10-CM | POA: Diagnosis not present

## 2020-06-26 DIAGNOSIS — R0981 Nasal congestion: Secondary | ICD-10-CM | POA: Diagnosis not present

## 2020-06-26 DIAGNOSIS — H6121 Impacted cerumen, right ear: Secondary | ICD-10-CM | POA: Diagnosis not present

## 2020-07-19 DIAGNOSIS — X32XXXA Exposure to sunlight, initial encounter: Secondary | ICD-10-CM | POA: Diagnosis not present

## 2020-07-19 DIAGNOSIS — L538 Other specified erythematous conditions: Secondary | ICD-10-CM | POA: Diagnosis not present

## 2020-07-19 DIAGNOSIS — L821 Other seborrheic keratosis: Secondary | ICD-10-CM | POA: Diagnosis not present

## 2020-07-19 DIAGNOSIS — L570 Actinic keratosis: Secondary | ICD-10-CM | POA: Diagnosis not present

## 2020-07-19 DIAGNOSIS — D2261 Melanocytic nevi of right upper limb, including shoulder: Secondary | ICD-10-CM | POA: Diagnosis not present

## 2020-07-19 DIAGNOSIS — D2262 Melanocytic nevi of left upper limb, including shoulder: Secondary | ICD-10-CM | POA: Diagnosis not present

## 2020-07-19 DIAGNOSIS — D2271 Melanocytic nevi of right lower limb, including hip: Secondary | ICD-10-CM | POA: Diagnosis not present

## 2020-07-19 DIAGNOSIS — L820 Inflamed seborrheic keratosis: Secondary | ICD-10-CM | POA: Diagnosis not present

## 2020-07-19 DIAGNOSIS — D2272 Melanocytic nevi of left lower limb, including hip: Secondary | ICD-10-CM | POA: Diagnosis not present

## 2020-07-19 DIAGNOSIS — D225 Melanocytic nevi of trunk: Secondary | ICD-10-CM | POA: Diagnosis not present

## 2020-07-25 DIAGNOSIS — E7800 Pure hypercholesterolemia, unspecified: Secondary | ICD-10-CM | POA: Diagnosis not present

## 2020-08-01 DIAGNOSIS — Z780 Asymptomatic menopausal state: Secondary | ICD-10-CM | POA: Diagnosis not present

## 2020-08-01 DIAGNOSIS — M8580 Other specified disorders of bone density and structure, unspecified site: Secondary | ICD-10-CM | POA: Diagnosis not present

## 2020-08-01 DIAGNOSIS — F5101 Primary insomnia: Secondary | ICD-10-CM | POA: Diagnosis not present

## 2020-08-01 DIAGNOSIS — E7800 Pure hypercholesterolemia, unspecified: Secondary | ICD-10-CM | POA: Diagnosis not present

## 2020-08-16 DIAGNOSIS — M8588 Other specified disorders of bone density and structure, other site: Secondary | ICD-10-CM | POA: Diagnosis not present

## 2020-08-28 DIAGNOSIS — H16049 Marginal corneal ulcer, unspecified eye: Secondary | ICD-10-CM | POA: Diagnosis not present

## 2020-08-28 DIAGNOSIS — H26493 Other secondary cataract, bilateral: Secondary | ICD-10-CM | POA: Diagnosis not present

## 2020-08-28 DIAGNOSIS — H5203 Hypermetropia, bilateral: Secondary | ICD-10-CM | POA: Diagnosis not present

## 2020-08-28 DIAGNOSIS — H02889 Meibomian gland dysfunction of unspecified eye, unspecified eyelid: Secondary | ICD-10-CM | POA: Diagnosis not present

## 2020-09-11 DIAGNOSIS — Z23 Encounter for immunization: Secondary | ICD-10-CM | POA: Diagnosis not present

## 2020-10-08 DIAGNOSIS — M170 Bilateral primary osteoarthritis of knee: Secondary | ICD-10-CM | POA: Diagnosis not present

## 2020-10-16 DIAGNOSIS — M170 Bilateral primary osteoarthritis of knee: Secondary | ICD-10-CM | POA: Diagnosis not present

## 2020-10-23 DIAGNOSIS — M170 Bilateral primary osteoarthritis of knee: Secondary | ICD-10-CM | POA: Diagnosis not present

## 2020-12-26 DIAGNOSIS — H60331 Swimmer's ear, right ear: Secondary | ICD-10-CM | POA: Diagnosis not present

## 2020-12-26 DIAGNOSIS — H6041 Cholesteatoma of right external ear: Secondary | ICD-10-CM | POA: Diagnosis not present

## 2021-01-02 DIAGNOSIS — L821 Other seborrheic keratosis: Secondary | ICD-10-CM | POA: Diagnosis not present

## 2021-01-02 DIAGNOSIS — L309 Dermatitis, unspecified: Secondary | ICD-10-CM | POA: Diagnosis not present

## 2021-02-01 DIAGNOSIS — E7800 Pure hypercholesterolemia, unspecified: Secondary | ICD-10-CM | POA: Diagnosis not present

## 2021-02-13 ENCOUNTER — Other Ambulatory Visit: Payer: Self-pay | Admitting: Family Medicine

## 2021-02-13 DIAGNOSIS — N6312 Unspecified lump in the right breast, upper inner quadrant: Secondary | ICD-10-CM | POA: Diagnosis not present

## 2021-02-13 DIAGNOSIS — F5101 Primary insomnia: Secondary | ICD-10-CM | POA: Diagnosis not present

## 2021-02-13 DIAGNOSIS — Z0000 Encounter for general adult medical examination without abnormal findings: Secondary | ICD-10-CM | POA: Diagnosis not present

## 2021-02-13 DIAGNOSIS — E7800 Pure hypercholesterolemia, unspecified: Secondary | ICD-10-CM | POA: Diagnosis not present

## 2021-02-19 ENCOUNTER — Other Ambulatory Visit: Payer: Self-pay | Admitting: *Deleted

## 2021-02-19 DIAGNOSIS — Z87891 Personal history of nicotine dependence: Secondary | ICD-10-CM

## 2021-02-27 DIAGNOSIS — H00025 Hordeolum internum left lower eyelid: Secondary | ICD-10-CM | POA: Diagnosis not present

## 2021-03-06 ENCOUNTER — Ambulatory Visit
Admission: RE | Admit: 2021-03-06 | Discharge: 2021-03-06 | Disposition: A | Discharge status: Home / Self Care | Payer: PPO | Source: Ambulatory Visit | Attending: Family Medicine | Admitting: Family Medicine

## 2021-03-06 ENCOUNTER — Other Ambulatory Visit: Payer: Self-pay

## 2021-03-06 DIAGNOSIS — N6312 Unspecified lump in the right breast, upper inner quadrant: Secondary | ICD-10-CM

## 2021-03-06 DIAGNOSIS — R922 Inconclusive mammogram: Secondary | ICD-10-CM | POA: Diagnosis not present

## 2021-03-07 ENCOUNTER — Telehealth (INDEPENDENT_AMBULATORY_CARE_PROVIDER_SITE_OTHER): Payer: PPO | Admitting: Acute Care

## 2021-03-07 ENCOUNTER — Encounter: Payer: Self-pay | Admitting: Acute Care

## 2021-03-07 DIAGNOSIS — Z87891 Personal history of nicotine dependence: Secondary | ICD-10-CM

## 2021-03-07 NOTE — Progress Notes (Signed)
Virtual Visit via Video Note  I connected with Nicole York on 03/07/21 at  8:00 AM EDT by a video enabled telemedicine application and verified that I am speaking with the correct person using two identifiers.  Location: Patient: Home Provider: Working from home    I discussed the limitations of evaluation and management by telemedicine and the availability of in person appointments. The patient expressed understanding and agreed to proceed.  Shared Decision Making Visit Lung Cancer Screening Program [phone])   Eligibility: Age 70 y.o. Pack Years Smoking History Calculation 43 (# packs/per year x # years smoked) Recent History of coughing up blood  no Unexplained weight loss? no ( >Than 15 pounds within the last 6 months ) Prior History Lung / other cancer no (Diagnosis within the last 5 years already requiring surveillance chest CT Scans). Smoking Status Former Smoker Former Smokers: Years since quit: 7 years  Quit Date: 05/04/2014  Visit Components: Discussion included one or more decision making aids. yes Discussion included risk/benefits of screening. yes Discussion included potential follow up diagnostic testing for abnormal scans. yes Discussion included meaning and risk of over diagnosis. yes Discussion included meaning and risk of False Positives. yes Discussion included meaning of total radiation exposure. yes  Counseling Included: Importance of adherence to annual lung cancer LDCT screening. yes Impact of comorbidities on ability to participate in the program. yes Ability and willingness to under diagnostic treatment. yes  Smoking Cessation Counseling: Current Smokers:  Discussed importance of smoking cessation. yes Information about tobacco cessation classes and interventions provided to patient. yes Patient provided with "ticket" for LDCT Scan. yes Symptomatic Patient. no  Counseling NA Diagnosis Code: Tobacco Use Z72.0 Asymptomatic Patient  yes  Counseling (Intermediate counseling: > three minutes counseling) M2707 Former Smokers:  Discussed the importance of maintaining cigarette abstinence. yes Diagnosis Code: Personal History of Nicotine Dependence. E67.544 Information about tobacco cessation classes and interventions provided to patient. Yes Patient provided with "ticket" for LDCT Scan. yes Written Order for Lung Cancer Screening with LDCT placed in Epic. Yes (CT Chest Lung Cancer Screening Low Dose W/O CM) BEE1007 Z12.2-Screening of respiratory organs Z87.891-Personal history of nicotine dependence   I spent 25 minutes of face to face time with her discussing the risks and benefits of lung cancer screening. We viewed a power point together that explained in detail the above noted topics. We took the time to pause the power point at intervals to allow for questions to be asked and answered to ensure understanding. We discussed that she had taken the single most powerful action possible to decrease her risk of developing lung cancer when she quit smoking. I counseled her to remain smoke free, and to contact me if she ever had the desire to smoke again so that I can provide resources and tools to help support the effort to remain smoke free. We discussed the time and location of the scan, and that either  Nicole Glassman RN or I will call with the results within  24-48 hours of receiving them. She has my card and contact information in the event she needs to speak with me, in addition to a copy of the power point we reviewed as a resource. she verbalized understanding of all of the above and had no further questions upon leaving the office.     I explained to the patient that there has been a high incidence of coronary artery disease noted on these exams. I explained that this is a non-gated exam  therefore degree or severity cannot be determined. This patient is on statin therapy. I have asked the patient to follow-up with their PCP  regarding any incidental finding of coronary artery disease and management with diet or medication as they feel is clinically indicated. The patient verbalized understanding of the above and had no further questions.   Takuma Cifelli D. Kenton Kingfisher, NP-C [facility] Personal contact information can be found on Amion  03/07/2021, 7:29 AM

## 2021-03-08 ENCOUNTER — Ambulatory Visit
Admission: RE | Admit: 2021-03-08 | Discharge: 2021-03-08 | Disposition: A | Discharge status: Home / Self Care | Payer: PPO | Source: Ambulatory Visit | Attending: Acute Care | Admitting: Acute Care

## 2021-03-08 ENCOUNTER — Other Ambulatory Visit: Payer: Self-pay

## 2021-03-08 DIAGNOSIS — Z87891 Personal history of nicotine dependence: Secondary | ICD-10-CM | POA: Diagnosis not present

## 2021-03-12 ENCOUNTER — Other Ambulatory Visit: Payer: Self-pay | Admitting: Acute Care

## 2021-03-12 DIAGNOSIS — Z87891 Personal history of nicotine dependence: Secondary | ICD-10-CM

## 2021-04-16 IMAGING — MG STEREOTACTIC VACUUM ASSIST RIGHT
8 of 10 series · 8 of 18 positions shown · non-contrast
Comparison: Previous exams.
COMPARISON: Previous exams.

Addendum:
CLINICAL DATA: Stereotactic biopsy was recommended of a group of
calcifications in the lower outer quadrant of the right breast.

EXAM:
RIGHT BREAST STEREOTACTIC CORE NEEDLE BIOPSY

[R (1 of 5)]
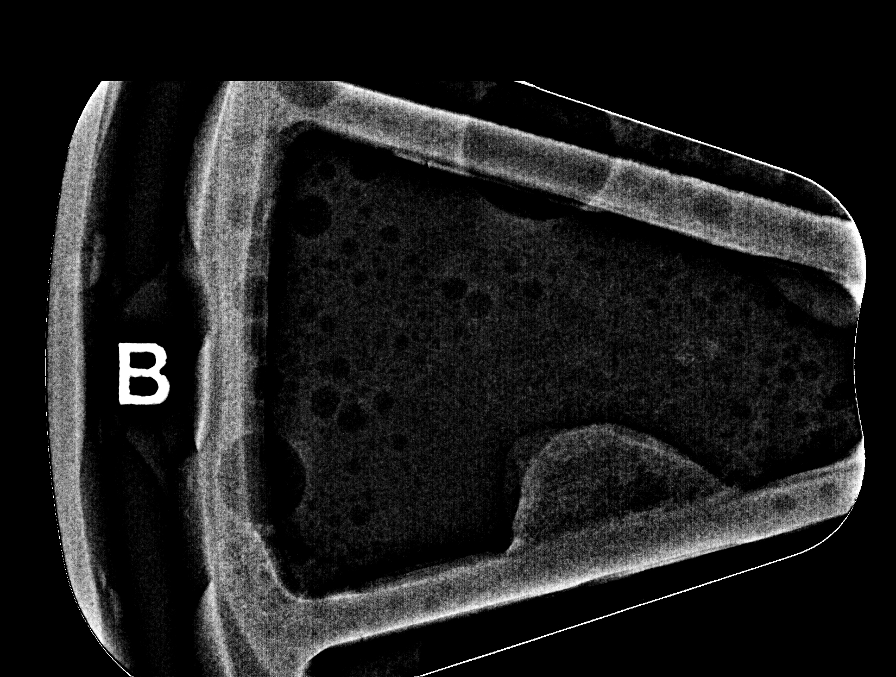

[R (2 of 5)]
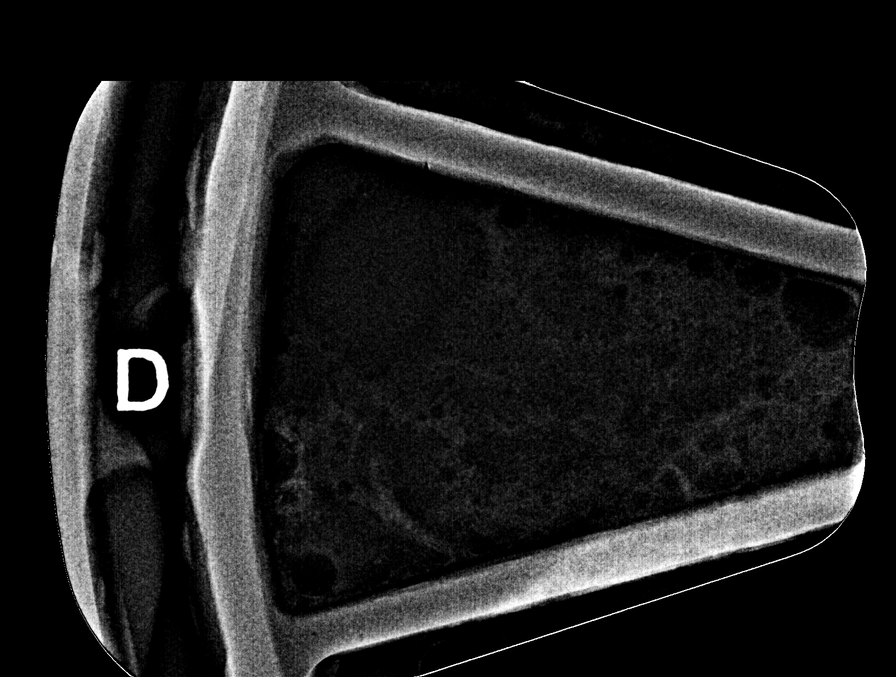

[R (3 of 5)]
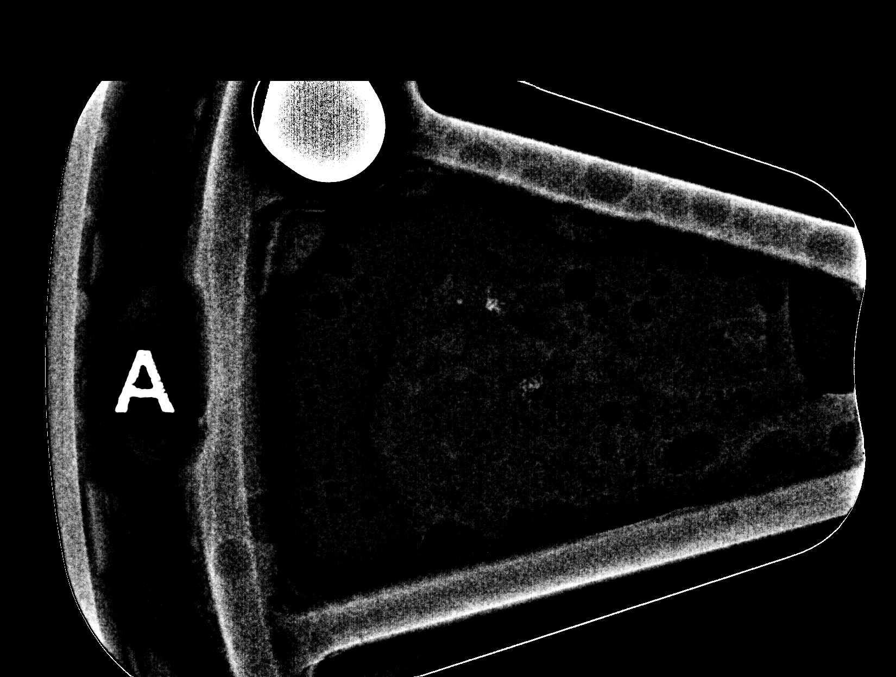

[R (4 of 5)]
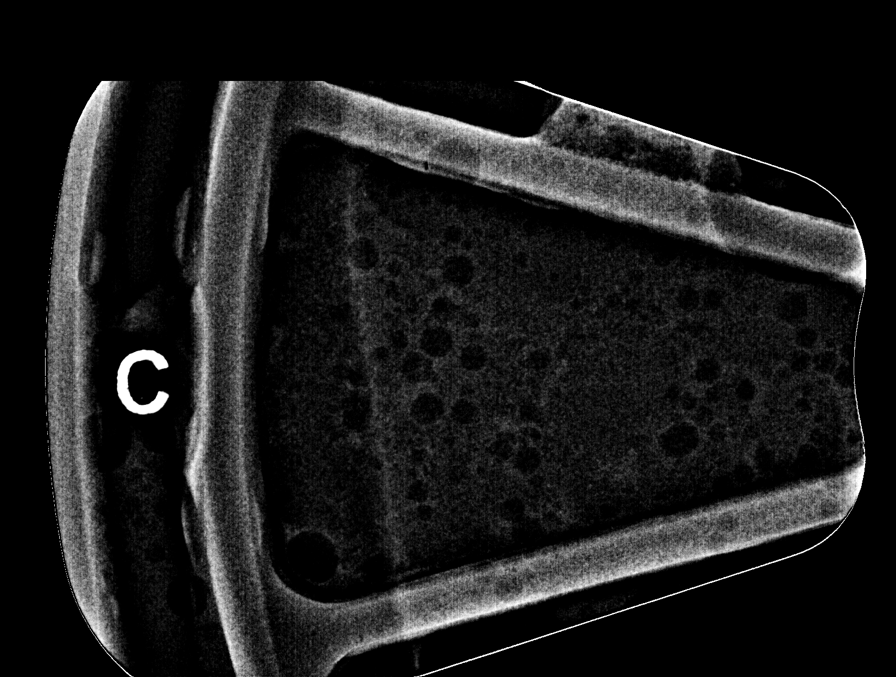

[R (5 of 5)]
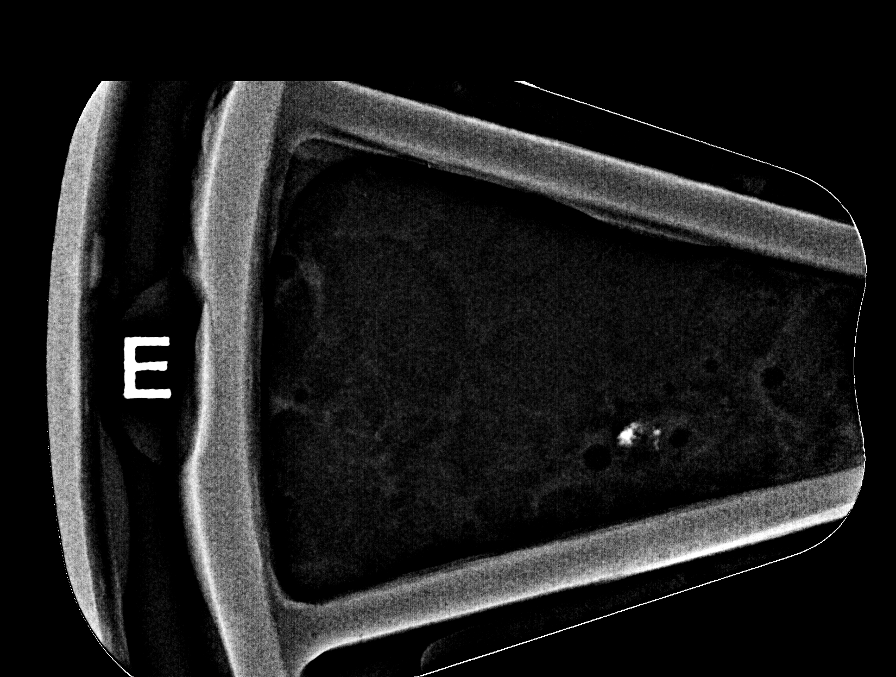

[R LM (1 of 3)]
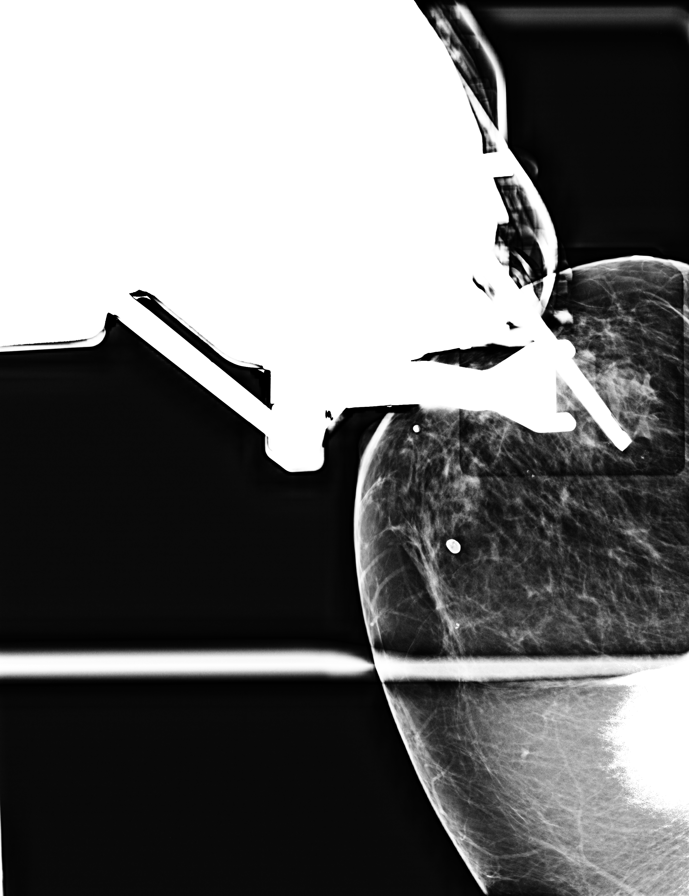

[R LM (2 of 3)]
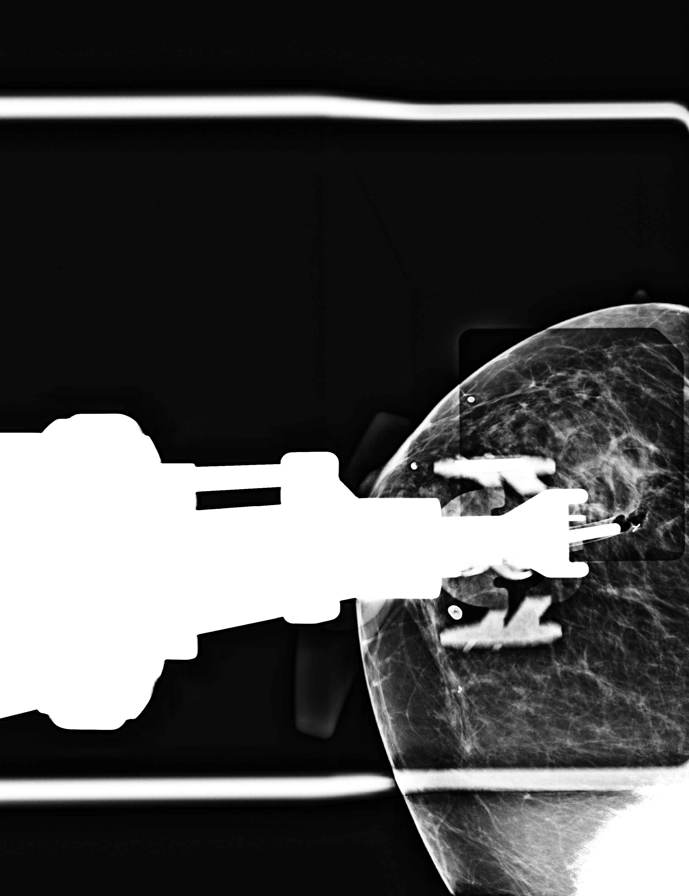

[R LM (3 of 3)]
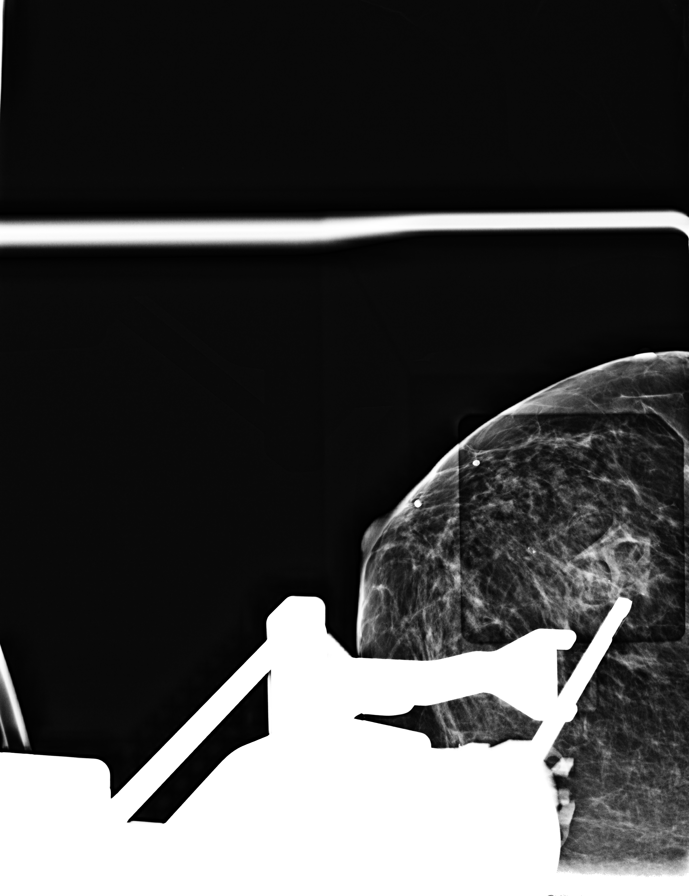

[8 of 18 positions shown; findings below may reference images not displayed]



Using sterile technique and 1% Lidocaine with without epinephrine as
local anesthetic, under stereotactic guidance, a 9 gauge vacuum
assisted device was used to perform core needle biopsy of
calcifications in the lower outer quadrant of the right breast using
a lateral approach. Specimen radiograph was performed showing
several calcifications within 2 of the cores. Specimens with
calcifications are identified for pathology.

Lesion quadrant: Lower outer quadrant

At the conclusion of the procedure, a ribbon shaped tissue marker
clip was deployed into the biopsy cavity. Follow-up 2-view mammogram
was performed and dictated separately.
IMPRESSION: Stereotactic-guided biopsy of the right breast. No apparent
complications.

ADDENDUM:
Pathology revealed BREAST, RIGHT, LOWER OUTER QUADRANT;
FIBROADENOMATOID CHANGES WITH STROMAL HYALINIZATION AND
CALCIFICATIONS. NEGATIVE FOR ATYPIA AND MALIGNANCY.

Concordant: Yes, per Dr. Francisco Javier Ceron.

Pathology results were discussed with the patient by telephone. The
patient reported doing well after the biopsy with tenderness at the
site. Post biopsy instructions and care were reviewed and questions
were answered. The patient was encouraged to call [HOSPITAL] Breast

Recommendation: The patient was instructed to return for annual
screening mammography and informed a reminder notice would be sent
regarding this appointment.

Addendum by Roger Tj RN on 12/02/2018.



Using sterile technique and 1% Lidocaine with without epinephrine as
local anesthetic, under stereotactic guidance, a 9 gauge vacuum
assisted device was used to perform core needle biopsy of
calcifications in the lower outer quadrant of the right breast using
a lateral approach. Specimen radiograph was performed showing
several calcifications within 2 of the cores. Specimens with
calcifications are identified for pathology.

Lesion quadrant: Lower outer quadrant

At the conclusion of the procedure, a ribbon shaped tissue marker
clip was deployed into the biopsy cavity. Follow-up 2-view mammogram
was performed and dictated separately.
IMPRESSION: Stereotactic-guided biopsy of the right breast. No apparent
complications.

## 2021-04-24 DIAGNOSIS — M1711 Unilateral primary osteoarthritis, right knee: Secondary | ICD-10-CM | POA: Diagnosis not present

## 2021-05-01 DIAGNOSIS — M1711 Unilateral primary osteoarthritis, right knee: Secondary | ICD-10-CM | POA: Diagnosis not present

## 2021-05-08 DIAGNOSIS — M1711 Unilateral primary osteoarthritis, right knee: Secondary | ICD-10-CM | POA: Diagnosis not present

## 2021-06-26 DIAGNOSIS — R1310 Dysphagia, unspecified: Secondary | ICD-10-CM | POA: Diagnosis not present

## 2021-06-26 DIAGNOSIS — H6041 Cholesteatoma of right external ear: Secondary | ICD-10-CM | POA: Diagnosis not present

## 2021-06-26 DIAGNOSIS — H60331 Swimmer's ear, right ear: Secondary | ICD-10-CM | POA: Diagnosis not present

## 2021-07-11 DIAGNOSIS — R1310 Dysphagia, unspecified: Secondary | ICD-10-CM | POA: Diagnosis not present

## 2021-07-11 DIAGNOSIS — Z86010 Personal history of colonic polyps: Secondary | ICD-10-CM | POA: Diagnosis not present

## 2021-07-25 DIAGNOSIS — D2262 Melanocytic nevi of left upper limb, including shoulder: Secondary | ICD-10-CM | POA: Diagnosis not present

## 2021-07-25 DIAGNOSIS — D2272 Melanocytic nevi of left lower limb, including hip: Secondary | ICD-10-CM | POA: Diagnosis not present

## 2021-07-25 DIAGNOSIS — D225 Melanocytic nevi of trunk: Secondary | ICD-10-CM | POA: Diagnosis not present

## 2021-07-25 DIAGNOSIS — L309 Dermatitis, unspecified: Secondary | ICD-10-CM | POA: Diagnosis not present

## 2021-07-25 DIAGNOSIS — D2261 Melanocytic nevi of right upper limb, including shoulder: Secondary | ICD-10-CM | POA: Diagnosis not present

## 2021-07-25 DIAGNOSIS — X32XXXA Exposure to sunlight, initial encounter: Secondary | ICD-10-CM | POA: Diagnosis not present

## 2021-07-25 DIAGNOSIS — Z85828 Personal history of other malignant neoplasm of skin: Secondary | ICD-10-CM | POA: Diagnosis not present

## 2021-07-29 DIAGNOSIS — L309 Dermatitis, unspecified: Secondary | ICD-10-CM | POA: Diagnosis not present

## 2021-07-31 DIAGNOSIS — L239 Allergic contact dermatitis, unspecified cause: Secondary | ICD-10-CM | POA: Diagnosis not present

## 2021-08-02 DIAGNOSIS — L239 Allergic contact dermatitis, unspecified cause: Secondary | ICD-10-CM | POA: Diagnosis not present

## 2021-08-06 DIAGNOSIS — E7800 Pure hypercholesterolemia, unspecified: Secondary | ICD-10-CM | POA: Diagnosis not present

## 2021-08-13 DIAGNOSIS — N289 Disorder of kidney and ureter, unspecified: Secondary | ICD-10-CM | POA: Diagnosis not present

## 2021-08-13 DIAGNOSIS — E7800 Pure hypercholesterolemia, unspecified: Secondary | ICD-10-CM | POA: Diagnosis not present

## 2021-08-13 DIAGNOSIS — I700 Atherosclerosis of aorta: Secondary | ICD-10-CM | POA: Diagnosis not present

## 2021-08-13 DIAGNOSIS — F5101 Primary insomnia: Secondary | ICD-10-CM | POA: Diagnosis not present

## 2021-10-01 ENCOUNTER — Encounter: Payer: Self-pay | Admitting: Internal Medicine

## 2021-10-02 ENCOUNTER — Ambulatory Visit: Payer: PPO | Admitting: Anesthesiology

## 2021-10-02 ENCOUNTER — Encounter
Admission: RE | Disposition: A | Discharge status: Home / Self Care | Payer: Self-pay | Source: Home / Self Care | Attending: Internal Medicine

## 2021-10-02 ENCOUNTER — Ambulatory Visit
Admission: RE | Admit: 2021-10-02 | Discharge: 2021-10-02 | Disposition: A | Discharge status: Home / Self Care | Payer: PPO | Attending: Internal Medicine | Admitting: Internal Medicine

## 2021-10-02 DIAGNOSIS — K222 Esophageal obstruction: Secondary | ICD-10-CM | POA: Diagnosis not present

## 2021-10-02 DIAGNOSIS — K2289 Other specified disease of esophagus: Secondary | ICD-10-CM | POA: Diagnosis not present

## 2021-10-02 DIAGNOSIS — K449 Diaphragmatic hernia without obstruction or gangrene: Secondary | ICD-10-CM | POA: Insufficient documentation

## 2021-10-02 DIAGNOSIS — K640 First degree hemorrhoids: Secondary | ICD-10-CM | POA: Insufficient documentation

## 2021-10-02 DIAGNOSIS — Z1211 Encounter for screening for malignant neoplasm of colon: Secondary | ICD-10-CM | POA: Diagnosis not present

## 2021-10-02 DIAGNOSIS — R1310 Dysphagia, unspecified: Secondary | ICD-10-CM | POA: Diagnosis not present

## 2021-10-02 DIAGNOSIS — D122 Benign neoplasm of ascending colon: Secondary | ICD-10-CM | POA: Insufficient documentation

## 2021-10-02 DIAGNOSIS — Z86010 Personal history of colonic polyps: Secondary | ICD-10-CM | POA: Insufficient documentation

## 2021-10-02 DIAGNOSIS — R1313 Dysphagia, pharyngeal phase: Secondary | ICD-10-CM | POA: Insufficient documentation

## 2021-10-02 HISTORY — PX: COLONOSCOPY WITH PROPOFOL: SHX5780

## 2021-10-02 HISTORY — PX: ESOPHAGOGASTRODUODENOSCOPY (EGD) WITH PROPOFOL: SHX5813

## 2021-10-02 HISTORY — DX: Eczematous dermatitis of unspecified eye, unspecified eyelid: H01.139

## 2021-10-02 HISTORY — DX: Irritable bowel syndrome, unspecified: K58.9

## 2021-10-02 HISTORY — DX: Other specified disorders of bone density and structure, unspecified site: M85.80

## 2021-10-02 SURGERY — ESOPHAGOGASTRODUODENOSCOPY (EGD) WITH PROPOFOL
Anesthesia: General

## 2021-10-02 MED ORDER — SODIUM CHLORIDE (PF) 0.9 % IJ SOLN
INTRAMUSCULAR | Status: DC | PRN
  Administered 2021-10-02: 1 mL

## 2021-10-02 MED ORDER — LIDOCAINE HCL (CARDIAC) PF 100 MG/5ML IV SOSY
PREFILLED_SYRINGE | INTRAVENOUS | Status: DC | PRN
  Administered 2021-10-02: 50 mg via INTRAVENOUS

## 2021-10-02 MED ORDER — PROPOFOL 10 MG/ML IV BOLUS
INTRAVENOUS | Status: DC | PRN
  Administered 2021-10-02: 50 mg via INTRAVENOUS
  Administered 2021-10-02: 20 mg via INTRAVENOUS

## 2021-10-02 MED ORDER — SODIUM CHLORIDE 0.9 % IV SOLN
INTRAVENOUS | Status: DC
Start: 2021-10-02 — End: 2021-10-02
  Administered 2021-10-02: 20 mL/h via INTRAVENOUS

## 2021-10-02 MED ORDER — PROPOFOL 500 MG/50ML IV EMUL
INTRAVENOUS | Status: DC | PRN
  Administered 2021-10-02: 120 ug/kg/min via INTRAVENOUS

## 2021-10-02 NOTE — Anesthesia Postprocedure Evaluation (Signed)
Anesthesia Post Note ? ?Patient: Nicole York ? ?Procedure(s) Performed: ESOPHAGOGASTRODUODENOSCOPY (EGD) WITH PROPOFOL ?COLONOSCOPY WITH PROPOFOL ? ?Patient location during evaluation: PACU ?Anesthesia Type: General ?Level of consciousness: awake and alert ?Pain management: pain level controlled ?Vital Signs Assessment: post-procedure vital signs reviewed and stable ?Respiratory status: spontaneous breathing, nonlabored ventilation, respiratory function stable and patient connected to nasal cannula oxygen ?Cardiovascular status: blood pressure returned to baseline and stable ?Postop Assessment: no apparent nausea or vomiting ?Anesthetic complications: no ? ? ?No notable events documented. ? ? ?Last Vitals:  ?Vitals:  ? 10/02/21 1235 10/02/21 1245  ?BP:    ?Pulse: 78 87  ?Resp: 20   ?Temp:    ?SpO2:    ?  ?Last Pain:  ?Vitals:  ? 10/02/21 1245  ?TempSrc:   ?PainSc: 0-No pain  ? ? ?  ?  ?  ?  ?  ?  ? ?Molli Barrows ? ? ? ? ?

## 2021-10-02 NOTE — Op Note (Signed)
[facility] ?Gastroenterology ?Patient Name: Nicole York ?Procedure Date: 10/02/2021 11:44 AM ?MRN: [id number] ?Account #: [id number] ?Date of Birth: 1950-10-27 ?Admit Type: Outpatient ?Age: 71 ?Room: [facility] ENDO ROOM 2 ?Gender: Female ?Note Status: Finalized ?Instrument Name: Colonscope [id number] ?Procedure:             Colonoscopy ?Indications:           High risk colon cancer surveillance: Personal history  ?                       of non-advanced adenoma ?Providers:             Benay Pike. Alice Reichert MD, MD ?Referring MD:          Dion Body (Referring MD) ?Medicines:             Propofol per Anesthesia ?Complications:         No immediate complications. ?Procedure:             Pre-Anesthesia Assessment: ?                       - The risks and benefits of the procedure and the  ?                       sedation options and risks were discussed with the  ?                       patient. All questions were answered and informed  ?                       consent was obtained. ?                       - Patient identification and proposed procedure were  ?                       verified prior to the procedure by the nurse. The  ?                       procedure was verified in the procedure room. ?                       - ASA Grade Assessment: III - A patient with severe  ?                       systemic disease. ?                       - After reviewing the risks and benefits, the patient  ?                       was deemed in satisfactory condition to undergo the  ?                       procedure. ?                       After obtaining informed consent, the colonoscope was  ?                       passed under direct vision.  Throughout the procedure,  ?                       the patient's blood pressure, pulse, and oxygen  ?                       saturations were monitored continuously. The  ?                       Colonoscope was introduced through the anus and  ?                       advanced to  the the cecum, identified by appendiceal  ?                       orifice and ileocecal valve. The colonoscopy was  ?                       performed without difficulty. The patient tolerated  ?                       the procedure well. The quality of the bowel  ?                       preparation was good. The ileocecal valve, appendiceal  ?                       orifice, and rectum were photographed. ?Findings: ?     The perianal and digital rectal examinations were normal. Pertinent  ?     negatives include normal sphincter tone and no palpable rectal lesions. ?     Non-bleeding internal hemorrhoids were found during retroflexion. The  ?     hemorrhoids were Grade I (internal hemorrhoids that do not prolapse). ?     A 11 mm polyp was found in the ascending colon. The polyp was  ?     carpet-like. The polyp was removed with a saline injection-lift  ?     technique using a hot snare. Resection and retrieval were complete. To  ?     prevent bleeding after the polypectomy, one hemostatic clip was  ?     successfully placed (MR conditional). There was no bleeding during, or  ?     at the end, of the procedure. ?     The exam was otherwise without abnormality. ?Impression:            - Non-bleeding internal hemorrhoids. ?                       - One 11 mm polyp in the ascending colon, removed  ?                       using injection-lift and a hot snare. Resected and  ?                       retrieved. Clip (MR conditional) was placed. ?                       - The examination was otherwise normal. ?Recommendation:        - Patient has a contact number available for  ?  emergencies. The signs and symptoms of potential  ?                       delayed complications were discussed with the patient.  ?                       Return to normal activities tomorrow. Written  ?                       discharge instructions were provided to the patient. ?                       - Monitor results to esophageal  dilation ?                       - Await pathology results from EGD, also performed  ?                       today. ?                       - Resume previous diet. ?                       - Continue present medications. ?                       - Repeat colonoscopy is recommended for surveillance.  ?                       The colonoscopy date will be determined after  ?                       pathology results from today's exam become available  ?                       for review. ?                       - Return to GI office at the next available  ?                       appointment. ?                       - Telephone GI office to schedule appointment. ?                       - The findings and recommendations were discussed with  ?                       the patient. ?Procedure Code(s):     --- Professional --- ?                       [phone], Colonoscopy, flexible; with removal of  ?                       tumor(s), polyp(s), or other lesion(s) by snare  ?                       technique ?  [id number], Colonoscopy, flexible; with directed submucosal  ?                       injection(s), any substance ?Diagnosis Code(s):     --- Professional --- ?                       K64.0, First degree hemorrhoids ?                       K63.5, Polyp of colon ?                       Z86.010, Personal history of colonic polyps ?CPT copyright 2019 [employer]. All rights reserved. ?The codes documented in this report are preliminary and upon coder review may  ?be revised to meet current compliance requirements. ?Efrain Sella MD, MD ?10/02/2021 12:20:58 PM ?This report has been signed electronically. ?Number of Addenda: 0 ?Note Initiated On: 10/02/2021 11:44 AM ?Scope Withdrawal Time: 0 hours 7 minutes 45 seconds  ?Total Procedure Duration: 0 hours 12 minutes 22 seconds  ?Estimated Blood Loss:  Estimated blood loss: none. ?     [facility] ?

## 2021-10-02 NOTE — Op Note (Signed)
[facility] ?Gastroenterology ?Patient Name: Nicole York ?Procedure Date: 10/02/2021 11:45 AM ?MRN: [id number] ?Account #: [id number] ?Date of Birth: Mar 27, 1951 ?Admit Type: Outpatient ?Age: 71 ?Room: [facility] ENDO ROOM 2 ?Gender: Female ?Note Status: Finalized ?Instrument Name: Upper Endoscope [id number] ?Procedure:             Upper GI endoscopy ?Indications:           Pharyngeal phase dysphagia ?Providers:             Benay Pike. Marquetta Weiskopf MD, MD ?Medicines:             Propofol per Anesthesia ?Complications:         No immediate complications. ?Procedure:             Pre-Anesthesia Assessment: ?                       - The risks and benefits of the procedure and the  ?                       sedation options and risks were discussed with the  ?                       patient. All questions were answered and informed  ?                       consent was obtained. ?                       - Patient identification and proposed procedure were  ?                       verified prior to the procedure by the nurse. The  ?                       procedure was verified in the procedure room. ?                       - ASA Grade Assessment: II - A patient with mild  ?                       systemic disease. ?                       - After reviewing the risks and benefits, the patient  ?                       was deemed in satisfactory condition to undergo the  ?                       procedure. ?                       After obtaining informed consent, the endoscope was  ?                       passed under direct vision. Throughout the procedure,  ?                       the patient's blood pressure, pulse, and oxygen  ?  saturations were monitored continuously. The Endoscope  ?                       was introduced through the mouth, and advanced to the  ?                       third part of duodenum. The upper GI endoscopy was  ?                       accomplished without difficulty. The patient  tolerated  ?                       the procedure well. ?Findings: ?     Mucosal changes including feline appearance were found in the entire  ?     esophagus. Esophageal findings were graded using the Eosinophilic  ?     Esophagitis Endoscopic Reference Score (EoE-EREFS) as: Edema Grade 0  ?     Normal (distinct vascular markings), Rings Grade 1 Mild (subtle  ?     circumferential ridges seen on esophageal distension), Exudates Grade 0  ?     None (no white lesions seen), Furrows Grade 0 None (no vertical lines  ?     seen) and Stricture present. Biopsies were obtained from the proximal  ?     and distal esophagus with cold forceps for histology of suspected  ?     eosinophilic esophagitis. ?     One benign-appearing, intrinsic moderate stenosis was found at the  ?     gastroesophageal junction. This stenosis measured 1.3 cm (inner  ?     diameter) x less than one cm (in length). The stenosis was traversed.  ?     The scope was withdrawn. Dilation was performed with a Maloney dilator  ?     with mild resistance at 52 Fr. ?     A 1 cm hiatal hernia was present. ?     No other significant abnormalities were identified in a careful  ?     examination of the stomach. ?     The examined duodenum was normal. ?     The exam was otherwise without abnormality. ?Impression:            - Esophageal mucosal changes suspicious for  ?                       eosinophilic esophagitis. Biopsied. ?                       - Benign-appearing esophageal stenosis. Dilated. ?                       - 1 cm hiatal hernia. ?                       - Normal examined duodenum. ?                       - The examination was otherwise normal. ?Recommendation:        - Await pathology results. ?                       - Monitor results to esophageal dilation ?                       -  Proceed with colonoscopy ?Procedure Code(s):     --- Professional --- ?                       [phone], Esophagogastroduodenoscopy, flexible,  ?                        transoral; with biopsy, single or multiple ?                       43450, Dilation of esophagus, by unguided sound or  ?                       bougie, single or multiple passes ?Diagnosis Code(s):     --- Professional --- ?                       R13.13, Dysphagia, pharyngeal phase ?                       K44.9, Diaphragmatic hernia without obstruction or  ?                       gangrene ?                       K22.2, Esophageal obstruction ?                       K22.8, Other specified diseases of esophagus ?CPT copyright 2019 American Medical Association. All rights reserved. ?The codes documented in this report are preliminary and upon coder review may  ?be revised to meet current compliance requirements. ?Efrain Sella MD, MD ?10/02/2021 12:03:42 PM ?This report has been signed electronically. ?Number of Addenda: 0 ?Note Initiated On: 10/02/2021 11:45 AM ?Estimated Blood Loss:  Estimated blood loss: none. ?     [facility] ?

## 2021-10-02 NOTE — H&P (Signed)
Outpatient short stay form Pre-procedure ?10/02/2021 10:13 AM ?Lorie Apley K. Alice Reichert, M.D. ? ?Primary Physician: Dion Body, M.D. ? ?Reason for visit:  Dysphagia, personal history of colon polyps ? ?History of present illness:   ? ?09/2015--redundant colon, diverticulosis sigmoid and descending, 3 mm polyp transverse, path w/ TA. 5 year repeat recommended. No evidence of microscopic colitis.  ? ?ASSESSMENT AND PLAN: ?Ms. Wiedemann is a 71 y.o. female presenting for follow up: ?Diagnoses and all orders for this visit: ? ?Dysphagia, unspecified type ?- Ambulatory Referral to Colonoscopy and Upper Endoscopy ? ?Personal history of colonic polyps ?- Ambulatory Referral to Colonoscopy and Upper Endoscopy ?No current facility-administered medications for this encounter. ? ?Medications Prior to Admission  ?Medication Sig Dispense Refill Last Dose  ? Ascorbic Acid (VITAMIN C) 1000 MG tablet Take 1,000 mg by mouth daily.     ? Biotin 1 MG CAPS Take by mouth.     ? Calcium Citrate-Vitamin D (CITRACAL + D PO) Take by mouth daily at 8 pm.     ? Zinc 50 MG TABS Take by mouth daily at 8 pm.     ? zolpidem (AMBIEN) 5 MG tablet Take 5 mg by mouth at bedtime as needed for sleep.     ? alendronate (FOSAMAX) 70 MG tablet TAKE 1 TABLET (70 MG TOTAL) BY MOUTH EVERY 7 (SEVEN) DAYS.  5   ? aspirin 81 MG EC tablet Take 325 mg by mouth daily.       ? cetirizine (ZYRTEC) 10 MG tablet Take 10 mg by mouth daily.       ? cholecalciferol (VITAMIN D) 1000 UNITS tablet Take 1,000 Units by mouth daily.       ? escitalopram (LEXAPRO) 5 MG tablet Take 1 tablet (5 mg total) by mouth at bedtime. 30 tablet 0   ? fish oil-omega-3 fatty acids 1000 MG capsule Take 2 g by mouth daily.       ? fluticasone (FLONASE) 50 MCG/ACT nasal spray USE 2 SPRAY IN EACH NOSTRIL EVERY DAY 16 g 0   ? lovastatin (MEVACOR) 40 MG tablet TAKE 1 TABLET AT BEDTIME 90 tablet 0   ? Melatonin 10 MG TABS Take by mouth at bedtime.     ? MULTIPLE VITAMIN PO Take by mouth.       ? vitamin  E 400 UNIT capsule Take 400 Units by mouth daily.       ? ? ? ?No Known Allergies ? ? ?Past Medical History:  ?Diagnosis Date  ? Allergy   ? Depression   ? Eczematous dermatitis of eyelid   ? History of chicken pox   ? Hyperlipidemia   ? IBS (irritable bowel syndrome)   ? Osteopenia   ? ? ?Review of systems:  Otherwise negative.  ? ? ?Physical Exam ? ?Gen: Alert, oriented. Appears stated age.  ?HEENT: [place]/AT. PERRLA. ?Lungs: CTA, no wheezes. ?CV: RR nl S1, S2. ?Abd: soft, benign, no masses. BS+ ?Ext: No edema. Pulses 2+ ? ? ? ?Planned procedures: Proceed with EGD and colonoscopy. The patient understands the nature of the planned procedure, indications, risks, alternatives and potential complications including but not limited to bleeding, infection, perforation, damage to internal organs and possible oversedation/side effects from anesthesia. The patient agrees and gives consent to proceed.  ?Please refer to procedure notes for findings, recommendations and patient disposition/instructions.  ? ? ? ?Kagen Kunath K. Alice Reichert, M.D. ?Gastroenterology ?10/02/2021  10:13 AM ? ? ? ? ? ? ?

## 2021-10-02 NOTE — Transfer of Care (Signed)
Immediate Anesthesia Transfer of Care Note ? ?Patient: Nicole York ? ?Procedure(s) Performed: ESOPHAGOGASTRODUODENOSCOPY (EGD) WITH PROPOFOL ?COLONOSCOPY WITH PROPOFOL ? ?Patient Location: Endoscopy Unit ? ?Anesthesia Type:General ? ?Level of Consciousness: drowsy ? ?Airway & Oxygen Therapy: Patient Spontanous Breathing and Patient connected to nasal cannula oxygen ? ?Post-op Assessment: Report given to RN and Post -op Vital signs reviewed and stable ? ?Post vital signs: Reviewed and stable ? ?Last Vitals:  ?Vitals Value Taken Time  ?BP 117/48 10/02/21 1223  ?Temp    ?Pulse 86 10/02/21 1223  ?Resp 14 10/02/21 1223  ?SpO2 100 % 10/02/21 1223  ?Vitals shown include unvalidated device data. ? ?Last Pain:  ?Vitals:  ? 10/02/21 1056  ?TempSrc: Temporal  ?PainSc:   ?   ? ?  ? ?Complications: No notable events documented. ?

## 2021-10-02 NOTE — Anesthesia Preprocedure Evaluation (Signed)
Anesthesia Evaluation  ?Patient identified by MRN, date of birth, ID band ?Patient awake ? ? ? ?Reviewed: ?Allergy & Precautions, H&P , NPO status , Patient's Chart, lab work & pertinent test results, reviewed documented beta blocker date and time  ? ?Airway ?Mallampati: II ? ? ?Neck ROM: full ? ? ? Dental ? ?(+) Poor Dentition ?  ?Pulmonary ?neg pulmonary ROS, former smoker,  ?  ?Pulmonary exam normal ? ? ? ? ? ? ? Cardiovascular ?Exercise Tolerance: Good ?negative cardio ROS ?Normal cardiovascular exam ?Rhythm:regular Rate:Normal ? ? ?  ?Neuro/Psych ?Anxiety Depression negative neurological ROS ? negative psych ROS  ? GI/Hepatic ?negative GI ROS, Neg liver ROS,   ?Endo/Other  ?negative endocrine ROS ? Renal/GU ?negative Renal ROS  ?negative genitourinary ?  ?Musculoskeletal ? ? Abdominal ?  ?Peds ? Hematology ?negative hematology ROS ?(+)   ?Anesthesia Other Findings ?Past Medical History: ?No date: Allergy ?No date: Depression ?No date: Eczematous dermatitis of eyelid ?No date: History of chicken pox ?No date: Hyperlipidemia ?No date: IBS (irritable bowel syndrome) ?No date: Osteopenia ?Past Surgical History: ?06/18/15: BREAST BIOPSY; Left ?    Comment:  BENIGN BREAST TISSUE WITH STROMAL FIBROSIS AND  ?             FIBROCYSTIC CHANGE.  ?11/30/2018: BREAST BIOPSY; Right ?    Comment:  affirm bx   ribbon marker   path pending ?1989: BREAST EXCISIONAL BIOPSY; Right ?09/18/2015: COLONOSCOPY WITH PROPOFOL; N/A ?    Comment:  Procedure: COLONOSCOPY WITH PROPOFOL;  Surgeon: Billie Ruddy ?             Gustavo Lah, MD;  Location: [facility] ENDOSCOPY;  Service:  ?             Endoscopy;  Laterality: N/A; ?No date: TONSILLECTOMY ?No date: TUBAL LIGATION ?BMI   ? Body Mass Index: 22.86 kg/m?  ?  ? Reproductive/Obstetrics ?negative OB ROS ? ?  ? ? ? ? ? ? ? ? ? ? ? ? ? ?  ?  ? ? ? ? ? ? ? ? ?Anesthesia Physical ?Anesthesia Plan ? ?ASA: 2 ? ?Anesthesia Plan: General  ? ?Post-op Pain Management:    ? ?Induction:  ? ?PONV Risk Score and Plan:  ? ?Airway Management Planned:  ? ?Additional Equipment:  ? ?Intra-op Plan:  ? ?Post-operative Plan:  ? ?Informed Consent: I have reviewed the patients History and Physical, chart, labs and discussed the procedure including the risks, benefits and alternatives for the proposed anesthesia with the patient or authorized representative who has indicated his/her understanding and acceptance.  ? ? ? ?Dental Advisory Given ? ?Plan Discussed with: CRNA ? ?Anesthesia Plan Comments:   ? ? ? ? ? ? ?Anesthesia Quick Evaluation ? ?

## 2021-10-03 LAB — SURGICAL PATHOLOGY

## 2021-10-04 ENCOUNTER — Encounter: Payer: Self-pay | Admitting: Internal Medicine

## 2021-10-04 DIAGNOSIS — H5203 Hypermetropia, bilateral: Secondary | ICD-10-CM | POA: Diagnosis not present

## 2021-10-04 DIAGNOSIS — H35033 Hypertensive retinopathy, bilateral: Secondary | ICD-10-CM | POA: Diagnosis not present

## 2021-10-04 DIAGNOSIS — H02889 Meibomian gland dysfunction of unspecified eye, unspecified eyelid: Secondary | ICD-10-CM | POA: Diagnosis not present

## 2021-10-04 DIAGNOSIS — H26493 Other secondary cataract, bilateral: Secondary | ICD-10-CM | POA: Diagnosis not present

## 2021-10-24 DIAGNOSIS — H60339 Swimmer's ear, unspecified ear: Secondary | ICD-10-CM | POA: Diagnosis not present

## 2021-10-24 DIAGNOSIS — H6041 Cholesteatoma of right external ear: Secondary | ICD-10-CM | POA: Diagnosis not present

## 2022-01-08 DIAGNOSIS — M25561 Pain in right knee: Secondary | ICD-10-CM | POA: Diagnosis not present

## 2022-01-08 DIAGNOSIS — M1711 Unilateral primary osteoarthritis, right knee: Secondary | ICD-10-CM | POA: Diagnosis not present

## 2022-01-08 DIAGNOSIS — G8929 Other chronic pain: Secondary | ICD-10-CM | POA: Diagnosis not present

## 2022-01-27 DIAGNOSIS — K219 Gastro-esophageal reflux disease without esophagitis: Secondary | ICD-10-CM | POA: Diagnosis not present

## 2022-01-27 DIAGNOSIS — R1310 Dysphagia, unspecified: Secondary | ICD-10-CM | POA: Diagnosis not present

## 2022-01-27 DIAGNOSIS — Z86010 Personal history of colonic polyps: Secondary | ICD-10-CM | POA: Diagnosis not present

## 2022-01-29 DIAGNOSIS — M170 Bilateral primary osteoarthritis of knee: Secondary | ICD-10-CM | POA: Diagnosis not present

## 2022-02-05 DIAGNOSIS — M170 Bilateral primary osteoarthritis of knee: Secondary | ICD-10-CM | POA: Diagnosis not present

## 2022-02-11 DIAGNOSIS — E7800 Pure hypercholesterolemia, unspecified: Secondary | ICD-10-CM | POA: Diagnosis not present

## 2022-02-11 DIAGNOSIS — I700 Atherosclerosis of aorta: Secondary | ICD-10-CM | POA: Diagnosis not present

## 2022-02-12 DIAGNOSIS — M170 Bilateral primary osteoarthritis of knee: Secondary | ICD-10-CM | POA: Diagnosis not present

## 2022-02-18 DIAGNOSIS — E7800 Pure hypercholesterolemia, unspecified: Secondary | ICD-10-CM | POA: Diagnosis not present

## 2022-02-18 DIAGNOSIS — Z0000 Encounter for general adult medical examination without abnormal findings: Secondary | ICD-10-CM | POA: Diagnosis not present

## 2022-02-18 DIAGNOSIS — Z862 Personal history of diseases of the blood and blood-forming organs and certain disorders involving the immune mechanism: Secondary | ICD-10-CM | POA: Diagnosis not present

## 2022-02-18 DIAGNOSIS — I700 Atherosclerosis of aorta: Secondary | ICD-10-CM | POA: Diagnosis not present

## 2022-02-19 ENCOUNTER — Other Ambulatory Visit: Payer: Self-pay | Admitting: Family Medicine

## 2022-02-19 DIAGNOSIS — Z1231 Encounter for screening mammogram for malignant neoplasm of breast: Secondary | ICD-10-CM

## 2022-02-25 DIAGNOSIS — I10 Essential (primary) hypertension: Secondary | ICD-10-CM | POA: Diagnosis not present

## 2022-02-25 DIAGNOSIS — E7800 Pure hypercholesterolemia, unspecified: Secondary | ICD-10-CM | POA: Diagnosis not present

## 2022-03-10 ENCOUNTER — Ambulatory Visit
Admission: RE | Admit: 2022-03-10 | Discharge: 2022-03-10 | Disposition: A | Discharge status: Home / Self Care | Payer: PPO | Source: Ambulatory Visit | Attending: Acute Care | Admitting: Acute Care

## 2022-03-10 DIAGNOSIS — Z122 Encounter for screening for malignant neoplasm of respiratory organs: Secondary | ICD-10-CM | POA: Insufficient documentation

## 2022-03-10 DIAGNOSIS — I700 Atherosclerosis of aorta: Secondary | ICD-10-CM | POA: Diagnosis not present

## 2022-03-10 DIAGNOSIS — R590 Localized enlarged lymph nodes: Secondary | ICD-10-CM | POA: Diagnosis not present

## 2022-03-10 DIAGNOSIS — Z87891 Personal history of nicotine dependence: Secondary | ICD-10-CM | POA: Insufficient documentation

## 2022-03-10 DIAGNOSIS — J439 Emphysema, unspecified: Secondary | ICD-10-CM | POA: Insufficient documentation

## 2022-03-11 ENCOUNTER — Telehealth: Payer: Self-pay | Admitting: Acute Care

## 2022-03-11 NOTE — Telephone Encounter (Signed)
Received call report from [facility] with [facility] radiology on LDCT done 03/10/22  Impression:  IMPRESSION: 1. Lung-RADS 2, benign appearance or behavior. Continue annual screening with low-dose chest CT without contrast in 12 months. 2. Interval development of a left axillary lymphadenopathy. While this may be reactive, correlation with recent mammographic screening history recommended. Finding may be amenable to clinical inspection. Close follow-up recommended and repeat CT chest in 3 months could be used to assess for resolution. 3. Aortic Atherosclerosis (ICD10-I70.0) and Emphysema (ICD10-J43.9).   These results will be called to the ordering clinician or representative by the Radiologist Assistant, and communication documented in the PACS or [employer].     Electronically Signed   By: Misty Stanley M.D.   On: 03/11/2022 08:52

## 2022-03-19 ENCOUNTER — Telehealth: Payer: Self-pay | Admitting: Acute Care

## 2022-03-19 DIAGNOSIS — Z87891 Personal history of nicotine dependence: Secondary | ICD-10-CM

## 2022-03-19 DIAGNOSIS — Z122 Encounter for screening for malignant neoplasm of respiratory organs: Secondary | ICD-10-CM

## 2022-03-19 NOTE — Telephone Encounter (Signed)
I have called the patient with the results of her low-dose screening CT.  I explained that her scan was read as a lung RADS 2.  Continue annual screening with low-dose CT chest without contrast in 12 months. I explained that there was an incidental finding of an interval development of a left axillary lymphadenopathy.  While this could be reactive they requested correlation with the recent mammographic screening or clinical inspection.  They recommended a repeat CT chest in 3 months to assess for resolution.  I spoke to the patient, she had recently had RSV, flu, and pneumonia vaccines.  She states that 2 of the 3 vaccines were given in her left arm. She is scheduled for mammogram 03/25/2022.  I have asked her to check with the mammography folks as she is planning on getting her COVID-vaccine next week and I suggested she wait until after her mammography to ensure no reactive adenopathy postvaccine. I am including Dr. Netty Starring on this message. Please follow-up as you feel is  clinically indicated, hopefully mammography will clarify left lymphadenopathy as potentially being reactive, if not please consider 77-monthfollow-up CT chest. Please do not hesitate to contact me if you have any questions or concerns DLangley Gauss next annual screening will be due 02/2023.  Dr. LNetty Starringis on this message so knows her scan was read as a lung RADS 2 and is aware of the left axillary lymphadenopathy. Thanks so much

## 2022-03-21 NOTE — Telephone Encounter (Signed)
Annual LDCT order placed for 02/2023

## 2022-04-22 DIAGNOSIS — H6041 Cholesteatoma of right external ear: Secondary | ICD-10-CM | POA: Diagnosis not present

## 2022-04-22 DIAGNOSIS — H60331 Swimmer's ear, right ear: Secondary | ICD-10-CM | POA: Diagnosis not present

## 2022-05-29 DIAGNOSIS — E7800 Pure hypercholesterolemia, unspecified: Secondary | ICD-10-CM | POA: Diagnosis not present

## 2022-06-05 DIAGNOSIS — E7800 Pure hypercholesterolemia, unspecified: Secondary | ICD-10-CM | POA: Diagnosis not present

## 2022-06-05 DIAGNOSIS — D508 Other iron deficiency anemias: Secondary | ICD-10-CM | POA: Diagnosis not present

## 2022-06-05 DIAGNOSIS — I700 Atherosclerosis of aorta: Secondary | ICD-10-CM | POA: Diagnosis not present

## 2022-06-05 DIAGNOSIS — F5101 Primary insomnia: Secondary | ICD-10-CM | POA: Diagnosis not present

## 2022-06-05 DIAGNOSIS — I10 Essential (primary) hypertension: Secondary | ICD-10-CM | POA: Diagnosis not present

## 2022-06-26 ENCOUNTER — Ambulatory Visit
Admission: RE | Admit: 2022-06-26 | Discharge: 2022-06-26 | Disposition: A | Discharge status: Home / Self Care | Payer: PPO | Source: Ambulatory Visit | Attending: Family Medicine | Admitting: Family Medicine

## 2022-06-26 DIAGNOSIS — Z1231 Encounter for screening mammogram for malignant neoplasm of breast: Secondary | ICD-10-CM | POA: Insufficient documentation

## 2022-07-30 DIAGNOSIS — Z85828 Personal history of other malignant neoplasm of skin: Secondary | ICD-10-CM | POA: Diagnosis not present

## 2022-07-30 DIAGNOSIS — D2261 Melanocytic nevi of right upper limb, including shoulder: Secondary | ICD-10-CM | POA: Diagnosis not present

## 2022-07-30 DIAGNOSIS — D2272 Melanocytic nevi of left lower limb, including hip: Secondary | ICD-10-CM | POA: Diagnosis not present

## 2022-07-30 DIAGNOSIS — R58 Hemorrhage, not elsewhere classified: Secondary | ICD-10-CM | POA: Diagnosis not present

## 2022-07-30 DIAGNOSIS — L570 Actinic keratosis: Secondary | ICD-10-CM | POA: Diagnosis not present

## 2022-07-30 DIAGNOSIS — D0439 Carcinoma in situ of skin of other parts of face: Secondary | ICD-10-CM | POA: Diagnosis not present

## 2022-07-30 DIAGNOSIS — D2262 Melanocytic nevi of left upper limb, including shoulder: Secondary | ICD-10-CM | POA: Diagnosis not present

## 2022-07-30 DIAGNOSIS — L820 Inflamed seborrheic keratosis: Secondary | ICD-10-CM | POA: Diagnosis not present

## 2022-07-30 DIAGNOSIS — L538 Other specified erythematous conditions: Secondary | ICD-10-CM | POA: Diagnosis not present

## 2022-07-30 DIAGNOSIS — D485 Neoplasm of uncertain behavior of skin: Secondary | ICD-10-CM | POA: Diagnosis not present

## 2022-08-26 DIAGNOSIS — I10 Essential (primary) hypertension: Secondary | ICD-10-CM | POA: Diagnosis not present

## 2022-08-26 DIAGNOSIS — E7800 Pure hypercholesterolemia, unspecified: Secondary | ICD-10-CM | POA: Diagnosis not present

## 2022-08-26 DIAGNOSIS — D508 Other iron deficiency anemias: Secondary | ICD-10-CM | POA: Diagnosis not present

## 2022-09-02 DIAGNOSIS — I700 Atherosclerosis of aorta: Secondary | ICD-10-CM | POA: Diagnosis not present

## 2022-09-02 DIAGNOSIS — E876 Hypokalemia: Secondary | ICD-10-CM | POA: Diagnosis not present

## 2022-09-02 DIAGNOSIS — E7800 Pure hypercholesterolemia, unspecified: Secondary | ICD-10-CM | POA: Diagnosis not present

## 2022-09-02 DIAGNOSIS — I10 Essential (primary) hypertension: Secondary | ICD-10-CM | POA: Diagnosis not present

## 2022-09-02 DIAGNOSIS — D508 Other iron deficiency anemias: Secondary | ICD-10-CM | POA: Diagnosis not present

## 2022-09-08 DIAGNOSIS — G8929 Other chronic pain: Secondary | ICD-10-CM | POA: Diagnosis not present

## 2022-09-08 DIAGNOSIS — M25562 Pain in left knee: Secondary | ICD-10-CM | POA: Diagnosis not present

## 2022-09-08 DIAGNOSIS — M25561 Pain in right knee: Secondary | ICD-10-CM | POA: Diagnosis not present

## 2022-09-16 DIAGNOSIS — M170 Bilateral primary osteoarthritis of knee: Secondary | ICD-10-CM | POA: Diagnosis not present

## 2022-09-23 DIAGNOSIS — M170 Bilateral primary osteoarthritis of knee: Secondary | ICD-10-CM | POA: Diagnosis not present

## 2022-10-01 DIAGNOSIS — D0439 Carcinoma in situ of skin of other parts of face: Secondary | ICD-10-CM | POA: Diagnosis not present

## 2022-10-21 DIAGNOSIS — H6122 Impacted cerumen, left ear: Secondary | ICD-10-CM | POA: Diagnosis not present

## 2022-10-21 DIAGNOSIS — H6041 Cholesteatoma of right external ear: Secondary | ICD-10-CM | POA: Diagnosis not present

## 2022-10-21 DIAGNOSIS — H60339 Swimmer's ear, unspecified ear: Secondary | ICD-10-CM | POA: Diagnosis not present

## 2023-02-24 DIAGNOSIS — H35033 Hypertensive retinopathy, bilateral: Secondary | ICD-10-CM | POA: Diagnosis not present

## 2023-02-24 DIAGNOSIS — H5203 Hypermetropia, bilateral: Secondary | ICD-10-CM | POA: Diagnosis not present

## 2023-02-24 DIAGNOSIS — H26493 Other secondary cataract, bilateral: Secondary | ICD-10-CM | POA: Diagnosis not present

## 2023-02-25 DIAGNOSIS — E7800 Pure hypercholesterolemia, unspecified: Secondary | ICD-10-CM | POA: Diagnosis not present

## 2023-02-25 DIAGNOSIS — E876 Hypokalemia: Secondary | ICD-10-CM | POA: Diagnosis not present

## 2023-02-25 DIAGNOSIS — D508 Other iron deficiency anemias: Secondary | ICD-10-CM | POA: Diagnosis not present

## 2023-03-04 ENCOUNTER — Other Ambulatory Visit: Payer: Self-pay

## 2023-03-04 DIAGNOSIS — R7401 Elevation of levels of liver transaminase levels: Secondary | ICD-10-CM | POA: Diagnosis not present

## 2023-03-04 DIAGNOSIS — E871 Hypo-osmolality and hyponatremia: Secondary | ICD-10-CM | POA: Diagnosis not present

## 2023-03-04 DIAGNOSIS — E7800 Pure hypercholesterolemia, unspecified: Secondary | ICD-10-CM | POA: Diagnosis not present

## 2023-03-04 DIAGNOSIS — Z122 Encounter for screening for malignant neoplasm of respiratory organs: Secondary | ICD-10-CM

## 2023-03-04 DIAGNOSIS — D508 Other iron deficiency anemias: Secondary | ICD-10-CM | POA: Diagnosis not present

## 2023-03-04 DIAGNOSIS — Z0000 Encounter for general adult medical examination without abnormal findings: Secondary | ICD-10-CM | POA: Diagnosis not present

## 2023-03-04 DIAGNOSIS — Z87891 Personal history of nicotine dependence: Secondary | ICD-10-CM

## 2023-03-04 DIAGNOSIS — I10 Essential (primary) hypertension: Secondary | ICD-10-CM | POA: Diagnosis not present

## 2023-03-04 DIAGNOSIS — F5101 Primary insomnia: Secondary | ICD-10-CM | POA: Diagnosis not present

## 2023-03-12 ENCOUNTER — Ambulatory Visit: Payer: PPO

## 2023-03-18 DIAGNOSIS — E871 Hypo-osmolality and hyponatremia: Secondary | ICD-10-CM | POA: Diagnosis not present

## 2023-03-18 DIAGNOSIS — R7401 Elevation of levels of liver transaminase levels: Secondary | ICD-10-CM | POA: Diagnosis not present

## 2023-03-18 DIAGNOSIS — I10 Essential (primary) hypertension: Secondary | ICD-10-CM | POA: Diagnosis not present

## 2023-03-25 ENCOUNTER — Ambulatory Visit
Admission: RE | Admit: 2023-03-25 | Discharge: 2023-03-25 | Disposition: A | Discharge status: Home / Self Care | Payer: PPO | Source: Ambulatory Visit | Attending: Family Medicine | Admitting: Family Medicine

## 2023-03-25 DIAGNOSIS — Z87891 Personal history of nicotine dependence: Secondary | ICD-10-CM | POA: Diagnosis not present

## 2023-03-25 DIAGNOSIS — Z122 Encounter for screening for malignant neoplasm of respiratory organs: Secondary | ICD-10-CM | POA: Diagnosis not present

## 2023-04-23 ENCOUNTER — Telehealth: Payer: Self-pay

## 2023-04-23 NOTE — Telephone Encounter (Signed)
LVM to call back and review results.

## 2023-04-27 DIAGNOSIS — M170 Bilateral primary osteoarthritis of knee: Secondary | ICD-10-CM | POA: Diagnosis not present

## 2023-04-30 ENCOUNTER — Telehealth: Payer: Self-pay | Admitting: Acute Care

## 2023-04-30 NOTE — Telephone Encounter (Signed)
Please call patient and let her know from a lung cancer perspective her scan was read as a LR 2, 12 month follow up. Please let her know there has been an increase in the size of a 9 x 5 mm nodule in the lateral aspect of the left breast which has become more conspicuous in the interval measuring 6 x 3 mm previously. Radiology recommend a mammogram. She did have a normal mammogram 06/2022, but she needs to be seen by either PCP or GYN to determine need for additional imaging of that area.  Please call her PCP office and let them know after you speak to the patient so they can make sure follow up imaging is done. Thanks so much.

## 2023-04-30 NOTE — Telephone Encounter (Signed)
LVM to call office back to review results.

## 2023-05-01 NOTE — Telephone Encounter (Signed)
Called patient's PCP, Dr. Burnadette Pop with [facility], and spoke with front desk French Ana. I informed her of the recs for pt to have a mammogram. French Ana states she will send a message to the nurse and call us back.

## 2023-05-04 DIAGNOSIS — M170 Bilateral primary osteoarthritis of knee: Secondary | ICD-10-CM | POA: Diagnosis not present

## 2023-05-05 NOTE — Telephone Encounter (Signed)
Spoke with patient. Reviewed results. She has already been contacted by her PCP office. She is scheduling a diagnostic mammogram and will complete her yearly mammogram 06/2023. No questions or concerns.

## 2023-05-06 ENCOUNTER — Other Ambulatory Visit: Payer: Self-pay | Admitting: Family Medicine

## 2023-05-06 DIAGNOSIS — F418 Other specified anxiety disorders: Secondary | ICD-10-CM | POA: Diagnosis not present

## 2023-05-06 DIAGNOSIS — F5101 Primary insomnia: Secondary | ICD-10-CM | POA: Diagnosis not present

## 2023-05-06 DIAGNOSIS — N63 Unspecified lump in unspecified breast: Secondary | ICD-10-CM

## 2023-05-07 ENCOUNTER — Ambulatory Visit
Admission: RE | Admit: 2023-05-07 | Discharge: 2023-05-07 | Disposition: A | Discharge status: Home / Self Care | Payer: PPO | Source: Ambulatory Visit | Attending: Family Medicine | Admitting: Family Medicine

## 2023-05-07 DIAGNOSIS — N6321 Unspecified lump in the left breast, upper outer quadrant: Secondary | ICD-10-CM | POA: Diagnosis not present

## 2023-05-07 DIAGNOSIS — N63 Unspecified lump in unspecified breast: Secondary | ICD-10-CM

## 2023-05-07 DIAGNOSIS — R92323 Mammographic fibroglandular density, bilateral breasts: Secondary | ICD-10-CM | POA: Diagnosis not present

## 2023-05-11 DIAGNOSIS — M170 Bilateral primary osteoarthritis of knee: Secondary | ICD-10-CM | POA: Diagnosis not present

## 2023-05-14 ENCOUNTER — Other Ambulatory Visit: Payer: Self-pay | Admitting: Family Medicine

## 2023-05-14 DIAGNOSIS — R928 Other abnormal and inconclusive findings on diagnostic imaging of breast: Secondary | ICD-10-CM

## 2023-05-14 DIAGNOSIS — R599 Enlarged lymph nodes, unspecified: Secondary | ICD-10-CM

## 2023-06-05 ENCOUNTER — Ambulatory Visit
Admission: RE | Admit: 2023-06-05 | Discharge: 2023-06-05 | Disposition: A | Discharge status: Home / Self Care | Payer: PPO | Source: Ambulatory Visit | Attending: Family Medicine | Admitting: Family Medicine

## 2023-06-05 DIAGNOSIS — R599 Enlarged lymph nodes, unspecified: Secondary | ICD-10-CM | POA: Diagnosis not present

## 2023-06-05 DIAGNOSIS — R928 Other abnormal and inconclusive findings on diagnostic imaging of breast: Secondary | ICD-10-CM | POA: Insufficient documentation

## 2023-06-05 DIAGNOSIS — D479 Neoplasm of uncertain behavior of lymphoid, hematopoietic and related tissue, unspecified: Secondary | ICD-10-CM | POA: Diagnosis not present

## 2023-06-05 DIAGNOSIS — R590 Localized enlarged lymph nodes: Secondary | ICD-10-CM | POA: Diagnosis not present

## 2023-06-05 DIAGNOSIS — N6321 Unspecified lump in the left breast, upper outer quadrant: Secondary | ICD-10-CM | POA: Diagnosis not present

## 2023-06-05 HISTORY — PX: BREAST BIOPSY: SHX20

## 2023-06-05 MED ORDER — LIDOCAINE 1 % OPTIME INJ - NO CHARGE
5.0000 mL | Freq: Once | INTRAMUSCULAR | Status: AC
  Administered 2023-06-05: 5 mL
  Filled 2023-06-05: qty 6

## 2023-06-05 MED ORDER — LIDOCAINE-EPINEPHRINE 2 %-1:100000 IJ SOLN
5.0000 mL | Freq: Once | INTRAMUSCULAR | Status: AC
  Administered 2023-06-05: 5 mL
  Filled 2023-06-05: qty 5.1

## 2023-06-09 LAB — SURGICAL PATHOLOGY

## 2023-06-10 DIAGNOSIS — D479 Neoplasm of uncertain behavior of lymphoid, hematopoietic and related tissue, unspecified: Secondary | ICD-10-CM | POA: Diagnosis not present

## 2023-06-16 ENCOUNTER — Encounter: Payer: Self-pay | Admitting: Oncology

## 2023-06-16 ENCOUNTER — Inpatient Hospital Stay: Payer: PPO

## 2023-06-16 ENCOUNTER — Inpatient Hospital Stay: Payer: PPO | Attending: Oncology | Admitting: Oncology

## 2023-06-16 VITALS — BP 137/68 | HR 65 | Temp 97.4°F | Resp 18 | Ht 62.0 in | Wt 122.8 lb

## 2023-06-16 DIAGNOSIS — Z87891 Personal history of nicotine dependence: Secondary | ICD-10-CM | POA: Diagnosis not present

## 2023-06-16 DIAGNOSIS — Z803 Family history of malignant neoplasm of breast: Secondary | ICD-10-CM | POA: Diagnosis not present

## 2023-06-16 DIAGNOSIS — C8202 Follicular lymphoma grade I, intrathoracic lymph nodes: Secondary | ICD-10-CM | POA: Insufficient documentation

## 2023-06-16 LAB — CMP (CANCER CENTER ONLY)
ALT: 38 U/L (ref 0–44)
AST: 41 U/L (ref 15–41)
Albumin: 4.4 g/dL (ref 3.5–5.0)
Alkaline Phosphatase: 75 U/L (ref 38–126)
Anion gap: 11 (ref 5–15)
BUN: 16 mg/dL (ref 8–23)
CO2: 26 mmol/L (ref 22–32)
Calcium: 9.8 mg/dL (ref 8.9–10.3)
Chloride: 95 mmol/L — ABNORMAL LOW (ref 98–111)
Creatinine: 0.72 mg/dL (ref 0.44–1.00)
GFR, Estimated: >60 mL/min (ref >60)
Glucose, Bld: 94 mg/dL (ref 70–99)
Potassium: 3.1 mmol/L — ABNORMAL LOW (ref 3.5–5.1)
Sodium: 132 mmol/L — ABNORMAL LOW (ref 135–145)
Total Bilirubin: 0.9 mg/dL (ref 0.0–1.2)
Total Protein: 7.4 g/dL (ref 6.5–8.1)

## 2023-06-16 LAB — CBC WITH DIFFERENTIAL (CANCER CENTER ONLY)
Abs Immature Granulocytes: 0.03 10*3/uL (ref 0.00–0.07)
Basophils Absolute: 0 10*3/uL (ref 0.0–0.1)
Basophils Relative: 0 %
Eosinophils Absolute: 0.1 10*3/uL (ref 0.0–0.5)
Eosinophils Relative: 1 %
HCT: 43.9 % (ref 36.0–46.0)
Hemoglobin: 14.9 g/dL (ref 12.0–15.0)
Immature Granulocytes: 0 %
Lymphocytes Relative: 25 %
Lymphs Abs: 1.9 10*3/uL (ref 0.7–4.0)
MCH: 29.6 pg (ref 26.0–34.0)
MCHC: 33.9 g/dL (ref 30.0–36.0)
MCV: 87.1 fL (ref 80.0–100.0)
Monocytes Absolute: 0.7 10*3/uL (ref 0.1–1.0)
Monocytes Relative: 9 %
Neutro Abs: 4.8 10*3/uL (ref 1.7–7.7)
Neutrophils Relative %: 65 %
Platelet Count: 288 10*3/uL (ref 150–400)
RBC: 5.04 MIL/uL (ref 3.87–5.11)
RDW: 13.5 % (ref 11.5–15.5)
WBC Count: 7.6 10*3/uL (ref 4.0–10.5)
nRBC: 0 % (ref 0.0–0.2)

## 2023-06-16 LAB — LACTATE DEHYDROGENASE: LDH: 173 U/L (ref 98–192)

## 2023-06-17 LAB — SURGICAL PATHOLOGY

## 2023-06-21 ENCOUNTER — Encounter: Payer: Self-pay | Admitting: Oncology

## 2023-06-21 NOTE — Progress Notes (Signed)
Hematology/Oncology Consult note [facility] Telephone:([phone] [phone]  Patient Care Team: Marisue Ivan, MD as PCP - General (Family Medicine) Marisue Ivan, MD as Referring Physician (Family Medicine) Earline Mayotte, MD (General Surgery)   Name of the patient: Nicole York  [id number]  1950-08-13    Reason for referral-concern for lymphoproliferative disorder   Referring physician-Dr. Burnadette Pop  Date of visit: 06/21/23   History of presenting illness- Patient is a 73 year old female with a past medical history signal taken for hyperlipidemia osteopenia IBS who underwent CT chest as a part of lung cancer screening program which incidentally showed a 9 mm nodule in the lateral aspect of the left breast which was more conspicuous as compared to what was noted on CT in October 2023 when it was 6 mm.  This was followed by a bilateral diagnostic mammogram which showed that this 9 mm mass appears to be an intramammary lymph node in the left upper outer breast.  There were no suspicious breast masses noted in the left breast.  No findings suggestive of malignancy in the right breast.  Patient underwent core biopsy of this intramammary lymph node which showed atypical nodular B-cell infiltrate favoring a lymphoproliferative disorder.  THE SPECIMEN CONSISTS OF FRAGMENTED CORES OF LYMPHOID TISSUE WITH A      NODULAR ARCHITECTURE CONSISTING OF AN MOSTLY  UNPOLARIZED FOLLICLES BUT WITH      RARE TINGIBLE BODY MACROPHAGE.  CD20 HIGHLIGHTS THE FOLLICLES AS WELL AS      PARAFOLLICULAR SMALL B CELL WHICH ARE ALSO PREDOMINANTLY CD10 AND BCL6 POSITIVE.      BCL-2 IS ALSO POSITIVE IN THE MAJORITY OF THE CELLS, BUT IS FOCALLY WEAK/ABSENT      IN AREAS OF THE FOLLICLES.  CD23 HIGHLIGHTS AND EXPANDED FOLLICULAR DENDRITIC      CELL MESHWORK AS WELL AS POSITIVITY ON THE B CELLS.  THE PROLIFERATION RATE BY      KI-67 IS LOW (LESS THAN 5%) AND IS  ABNORMALLY LOW IN THE AREA OF FOLLICLES.      CD3/CD5/CD43 HIGHLIGHTS SMALL BACKGROUND T CELLS.  CYCLIN D1 IS NEGATIVE.  FLOW      CYTOMETRY SHOWS A DUAL POPULATION OF B CELLS WITH A SUBSET SHOWING POLYCLONAL B      CELLS AND ANOTHER SUBSET THAT IS CD10 POSITIVE AND OVERALL APPEARS DIM FOR      LAMBDA BUT NOT DEFINITIVELY PATHOGNOMONIC FOR LAMBDA RESTRICTION.  THE OVERALL      FINDINGS ARE ATYPICAL AND FAVOR A LYMPHOPROLIFERATIVE DISORDER WITH A      DIFFERENTIAL DIAGNOSIS INCLUDING BOTH A FOLLICULAR LYMPHOMA (FAVORED) AS WELL AS      A MARGINAL ZONE LYMPHOMA WITH FOLLICULAR COLONIZATION.  TO FURTHER      ASSESS/CHARACTERIZE THE LESION MOLECULAR TESTING FOR AN IGH GENE REARRANGEMENT      AS WELL AS LOW-GRADE B-CELL LYMPHOMA.  FISH studies are currently pending  Patient overall feels well denies any changes in her appetite or weight.  Denies any drenching night sweats.  Denies any palpable lumps or bumps anywhere.   ECOG PS- 0  Pain scale- 0   Review of systems- Review of Systems  Constitutional:  Negative for chills, fever, malaise/fatigue and weight loss.  HENT:  Negative for congestion, ear discharge and nosebleeds.   Eyes:  Negative for blurred vision.  Respiratory:  Negative for cough, hemoptysis, sputum production, shortness of breath and wheezing.   Cardiovascular:  Negative for chest pain, palpitations, orthopnea and claudication.  Gastrointestinal:  Negative for abdominal pain, blood in stool, constipation, diarrhea, heartburn, melena, nausea and vomiting.  Genitourinary:  Negative for dysuria, flank pain, frequency, hematuria and urgency.  Musculoskeletal:  Negative for back pain, joint pain and myalgias.  Skin:  Negative for rash.  Neurological:  Negative for dizziness, tingling, focal weakness, seizures, weakness and headaches.  Endo/Heme/Allergies:  Does not bruise/bleed easily.  Psychiatric/Behavioral:  Negative for depression and suicidal ideas. The patient does not have  insomnia.     No Known Allergies  Patient Active Problem List   Diagnosis Date Noted   Eczematous dermatitis of eyelid 05/02/2016   Abnormal mammogram of left breast 05/30/2015   Insomnia secondary to anxiety 09/17/2013   Visit for preventive health examination 09/15/2013   Hyperlipidemia LDL goal <100 06/02/2012   Encounter for long-term (current) use of other medications 06/02/2012   Routine general medical examination at a health care facility 06/02/2012   Tobacco abuse 05/08/2011   Tobacco abuse counseling 05/08/2011     Past Medical History:  Diagnosis Date   Allergy    Depression    Eczematous dermatitis of eyelid    History of chicken pox    Hyperlipidemia    IBS (irritable bowel syndrome)    Osteopenia      Past Surgical History:  Procedure Laterality Date   BREAST BIOPSY Left 06/18/2015   BENIGN BREAST TISSUE WITH STROMAL FIBROSIS AND FIBROCYSTIC CHANGE.    BREAST BIOPSY Right 11/30/2018   affirm bx   ribbon marker   neg   BREAST BIOPSY Left 06/05/2023   [place] bx 2:00 9 cmfn, heart marker, path pending   BREAST BIOPSY Left 06/05/2023   [place] LT BREAST BX W LOC DEV 1ST LESION IMG BX SPEC US GUIDE 06/05/2023 [facility]   BREAST EXCISIONAL BIOPSY Right 1989   COLONOSCOPY WITH PROPOFOL N/A 09/18/2015   Procedure: COLONOSCOPY WITH PROPOFOL;  Surgeon: Christena Deem, MD;  Location: [facility] ENDOSCOPY;  Service: Endoscopy;  Laterality: N/A;   COLONOSCOPY WITH PROPOFOL N/A 10/02/2021   Procedure: COLONOSCOPY WITH PROPOFOL;  Surgeon: Toledo, Boykin Nearing, MD;  Location: [facility] ENDOSCOPY;  Service: Gastroenterology;  Laterality: N/A;   ESOPHAGOGASTRODUODENOSCOPY (EGD) WITH PROPOFOL N/A 10/02/2021   Procedure: ESOPHAGOGASTRODUODENOSCOPY (EGD) WITH PROPOFOL;  Surgeon: Toledo, Boykin Nearing, MD;  Location: [facility] ENDOSCOPY;  Service: Gastroenterology;  Laterality: N/A;   TONSILLECTOMY     TUBAL LIGATION      Social History   Socioeconomic History   Marital status: Married     Spouse name: Not on file   Number of children: Not on file   Years of education: Not on file   Highest education level: Not on file  Occupational History   Not on file  Tobacco Use   Smoking status: Former    Current packs/day: 0.00    Average packs/day: 1 pack/day for 43.0 years (43.0 ttl pk-yrs)    Types: Cigarettes    Start date: 05/05/1971    Quit date: 05/04/2014    Years since quitting: 9.1   Smokeless tobacco: Never  Vaping Use   Vaping status: Never Used  Substance and Sexual Activity   Alcohol use: Not Currently    Comment: socially   Drug use: No   Sexual activity: Not on file  Other Topics Concern   Not on file  Social History Narrative   Not on file   Social Drivers of Health   Financial Resource Strain: Low Risk  (03/04/2023)   Received from [facility]   Overall  Financial Resource Strain (CARDIA)    Difficulty of Paying Living Expenses: Not hard at all  Food Insecurity: No Food Insecurity (06/16/2023)   Hunger Vital Sign    Worried About Running Out of Food in the Last Year: Never true    Ran Out of Food in the Last Year: Never true  Transportation Needs: No Transportation Needs (06/16/2023)   PRAPARE - Administrator, Civil Service (Medical): No    Lack of Transportation (Non-Medical): No  Physical Activity: Not on file  Stress: Not on file  Social Connections: Not on file  Intimate Partner Violence: Not At Risk (06/16/2023)   Humiliation, Afraid, Rape, and Kick questionnaire    Fear of Current or Ex-Partner: No    Emotionally Abused: No    Physically Abused: No    Sexually Abused: No     Family History  Problem Relation Age of Onset   Breast cancer Mother    Hyperlipidemia Mother    Cancer Mother 53       breast   Mental illness Mother        Alzheimers Dementia   Hyperlipidemia Father    Hypertension Father      Current Outpatient Medications:    Ascorbic Acid (VITAMIN C) 1000 MG tablet, Take 1,000 mg by  mouth daily., Disp: , Rfl:    aspirin 81 MG EC tablet, Take 325 mg by mouth daily.  , Disp: , Rfl:    atorvastatin (LIPITOR) 40 MG tablet, Take 1 tablet by mouth at bedtime., Disp: , Rfl:    Calcium Citrate-Vitamin D (CITRACAL + D PO), Take by mouth daily at 8 pm., Disp: , Rfl:    cetirizine (ZYRTEC) 10 MG tablet, Take 10 mg by mouth daily.  , Disp: , Rfl:    chlorthalidone (HYGROTON) 25 MG tablet, Take 1 tablet by mouth daily., Disp: , Rfl:    cholecalciferol (VITAMIN D) 1000 UNITS tablet, Take 1,000 Units by mouth daily.  , Disp: , Rfl:    cyanocobalamin (VITAMIN B12) 1000 MCG tablet, Take 1,000 mcg by mouth daily., Disp: , Rfl:    escitalopram (LEXAPRO) 5 MG tablet, Take 1 tablet (5 mg total) by mouth at bedtime., Disp: 30 tablet, Rfl: 0   ferrous sulfate 324 (65 Fe) MG TBEC, Take by mouth., Disp: , Rfl:    fish oil-omega-3 fatty acids 1000 MG capsule, Take 2 g by mouth daily.  , Disp: , Rfl:    fluticasone (FLONASE) 50 MCG/ACT nasal spray, USE 2 SPRAY IN EACH NOSTRIL EVERY DAY, Disp: 16 g, Rfl: 0   MULTIPLE VITAMIN PO, Take by mouth.  , Disp: , Rfl:    Multiple Vitamins-Minerals (MULTIVITAMIN ADULTS) TABS, Take by mouth., Disp: , Rfl:    traZODone (DESYREL) 50 MG tablet, Take by mouth., Disp: , Rfl:    vitamin E 400 UNIT capsule, Take 400 Units by mouth daily.  , Disp: , Rfl:    alendronate (FOSAMAX) 70 MG tablet, TAKE 1 TABLET (70 MG TOTAL) BY MOUTH EVERY 7 (SEVEN) DAYS. (Patient not taking: Reported on 06/16/2023), Disp: , Rfl: 5   Biotin 1 MG CAPS, Take by mouth. (Patient not taking: Reported on 06/16/2023), Disp: , Rfl:    Zinc 50 MG TABS, Take by mouth daily at 8 pm. (Patient not taking: Reported on 06/16/2023), Disp: , Rfl:    zolpidem (AMBIEN) 5 MG tablet, Take 5 mg by mouth at bedtime as needed for sleep. (Patient not taking: Reported on 06/16/2023), Disp: ,  Rfl:    Physical exam:  Vitals:   06/16/23 1505  BP: 137/68  Pulse: 65  Resp: 18  Temp: (!) 97.4 F (36.3 C)  TempSrc:  Tympanic  SpO2: 99%  Weight: 122 lb 12.8 oz (55.7 kg)  Height: 5\' 2"  (1.575 m)   Physical Exam Cardiovascular:     Rate and Rhythm: Normal rate and regular rhythm.     Heart sounds: Normal heart sounds.  Pulmonary:     Effort: Pulmonary effort is normal.     Breath sounds: Normal breath sounds.  Abdominal:     General: Bowel sounds are normal.     Palpations: Abdomen is soft.     Comments: No palpable hepatosplenomegaly  Musculoskeletal:     Cervical back: Normal range of motion.  Lymphadenopathy:     Comments: No palpable cervical, supraclavicular, axillary or inguinal adenopathy    Skin:    General: Skin is warm and dry.  Neurological:     Mental Status: She is alert and oriented to person, place, and time.           Latest Ref Rng & Units 06/16/2023    3:50 PM  CMP  Glucose 70 - 99 mg/dL 94   BUN 8 - 23 mg/dL 16   Creatinine 5.40 - 1.00 mg/dL 9.81   Sodium 191 - 478 mmol/L 132   Potassium 3.5 - 5.1 mmol/L 3.1   Chloride 98 - 111 mmol/L 95   CO2 22 - 32 mmol/L 26   Calcium 8.9 - 10.3 mg/dL 9.8   Total Protein 6.5 - 8.1 g/dL 7.4   Total Bilirubin 0.0 - 1.2 mg/dL 0.9   Alkaline Phos 38 - 126 U/L 75   AST 15 - 41 U/L 41   ALT 0 - 44 U/L 38       Latest Ref Rng & Units 06/16/2023    3:49 PM  CBC  WBC 4.0 - 10.5 K/uL 7.6   Hemoglobin 12.0 - 15.0 g/dL 29.5   Hematocrit 62.1 - 46.0 % 43.9   Platelets 150 - 400 K/uL 288     No images are attached to the encounter.  [place] LT BREAST BX W LOC DEV 1ST LESION IMG BX SPEC US GUIDE Addendum Date: 06/11/2023 ADDENDUM REPORT: 06/11/2023 11:21 ADDENDUM: PATHOLOGY revealed: 1. Lymph node, needle/core biopsy, Left breast 2 o'clock, 9 cmfn, IM node, heart clip : - ATYPICAL NODULAR B CELL INFILTRATE, FAVOR A LYMPHOPROLIFERATIVE DISORDER. NOTE: THE SPECIMEN CONSISTS OF FRAGMENTED CORES OF LYMPHOID TISSUE WITH A NODULAR ARCHITECTURE CONSISTING OF AN MOSTLY UNPOLARIZED FOLLICLES BUT WITH RARE TINGIBLE BODY MACROPHAGE. CD20  HIGHLIGHTS THE FOLLICLES AS WELL AS PARAFOLLICULAR SMALL B CELL WHICH ARE ALSO PREDOMINANTLY CD10 AND BCL6 POSITIVE. BCL-2 IS ALSO POSITIVE IN THE MAJORITY OF THE CELLS, BUT IS FOCALLY WEAK/ABSENT IN AREAS OF THE FOLLICLES. CD23 HIGHLIGHTS AND EXPANDED FOLLICULAR DENDRITIC CELL MESHWORK AS WELL AS POSITIVITY ON THE B CELLS. THE PROLIFERATION RATE BY KI-67 IS LOW (LESS THAN 5%) AND IS ABNORMALLY LOW IN THE AREA OF FOLLICLES. CD3/CD5/CD43 HIGHLIGHTS SMALL BACKGROUND T CELLS. CYCLIN D1 IS NEGATIVE. FLOW CYTOMETRY SHOWS A DUAL POPULATION OF B CELLS WITH A SUBSET SHOWING POLYCLONAL B CELLS AND ANOTHER SUBSET THAT IS CD10 POSITIVE AND OVERALL APPEARS DIM FOR LAMBDA BUT NOT DEFINITIVELY PATHOGNOMONIC FOR LAMBDA RESTRICTION. THE OVERALL FINDINGS ARE ATYPICAL AND FAVOR A LYMPHOPROLIFERATIVE DISORDER WITH A DIFFERENTIAL DIAGNOSIS INCLUDING BOTH A FOLLICULAR LYMPHOMA (FAVORED) AS WELL AS A MARGINAL ZONE LYMPHOMA WITH FOLLICULAR COLONIZATION. TO FURTHER ASSESS/CHARACTERIZE  THE LESION MOLECULAR TESTING FOR AN IGH GENE REARRANGEMENT AS WELL AS LOW-GRADE B-CELL LYMPHOMA FISH ARE PENDING AND WILL BE REPORTED IN ADDENDUM. Pathology results are CONCORDANT with imaging findings, per Dr. Meda Klinefelter. Randa Lynn RN spoke with patient on 06/10/2023, and patient reported biopsy site doing well without adverse symptoms. Post biopsy care instructions were reviewed, questions were answered and my direct phone number was provided. Patient was instructed to call [facility] for any additional questions or concerns related to biopsy site. RECOMMENDATION: Please refer patient to Hematologist Oncologist. Provider, Dr. Marisue Ivan, requested that patient meet with him to discuss biopsy results and recommendations. Patient has appointment with provider on 06/10/2023 to discuss biopsy results. Pathology results reported by Randa Lynn RN on 06/11/2023. Electronically Signed   By: Meda Klinefelter M.D.   On: 06/11/2023 11:21    Result Date: 06/11/2023 CLINICAL DATA:  Enlargement of a LEFT breast intramammary lymph node EXAM: ULTRASOUND GUIDED LEFT BREAST CORE NEEDLE BIOPSY COMPARISON:  Previous exam(s). PROCEDURE: I met with the patient and we discussed the procedure of ultrasound-guided biopsy, including benefits and alternatives. We discussed the high likelihood of a successful procedure. We discussed the risks of the procedure, including infection, bleeding, tissue injury, clip migration, and inadequate sampling. Informed written consent was given. The usual time-out protocol was performed immediately prior to the procedure. Lesion quadrant: Upper outer quadrant Using sterile technique and 1% lidocaine and 2% lidocaine with epinephrine as local anesthetic, under direct ultrasound visualization, a 14 gauge spring-loaded device was used to perform biopsy of an intramammary lymph node at 2 o'clock 9 cm from the nipple using a lateral approach. At the conclusion of the procedure a heart shaped tissue marker clip was deployed into the biopsy cavity. Follow up 2 view mammogram was performed and dictated separately. IMPRESSION: Ultrasound guided biopsy of an intramammary lymph node. No apparent complications. Electronically Signed: By: Meda Klinefelter M.D. On: 06/05/2023 09:38   MM CLIP PLACEMENT LEFT Result Date: 06/05/2023 CLINICAL DATA:  Status post ultrasound-guided biopsy EXAM: 3D DIAGNOSTIC LEFT MAMMOGRAM POST ULTRASOUND BIOPSY COMPARISON:  Previous exam(s). FINDINGS: 3D Mammographic images were obtained following ultrasound guided biopsy of an intramammary lymph node. The heart biopsy marking clip is in expected position at the site of biopsy. IMPRESSION: Appropriate positioning of the heart shaped biopsy marking clip at the site of biopsy in the upper outer breast. Final Assessment: Post Procedure Mammograms for Marker Placement Electronically Signed   By: Meda Klinefelter M.D.   On: 06/05/2023 09:51    Assessment and  plan- Patient is a 73 y.o. female referred for low grade lymphoproliferative disorder favoring follicular lymphoma involving intramammary lymph node  Discussed with the patient that mammogram and ultrasound does not show any evidence of bilateral breast masses.  There was a prominent intramammary lymph node noted which was biopsied and was consistent with a low-grade lymphoproliferative disorder and cells were mostly positive for CD20, CD10 and BCL6 and Bcl-2.  Ki-67 was low less than 5% indicative of low-grade proliferative process.  Overall findings favor follicular lymphoma with marginal zone lymphoma and follicular colonization being in the differential.  Discussed with the patient difference between Hodgkin's and non-Hodgkin's lymphoma.  Non-Hodgkin's lymphomas can be either T-cell or B-cell lymphoma and patient has a B-cell lymphoma.  Within the subcategory of B-cell lymphoma there are various types of lymphoma which can be either low-grade or high-grade.  Patient has a low-grade variety of B-cell lymphoma which does not necessarily need treatment and  can be monitored conservatively.  Indications for treating a low-grade follicular lymphoma would be development of worsening pancytopenia, B symptoms such as unintentional weight loss fatigue and drenching night sweats , significant splenomegaly or worsening lymphadenopathy that is bulky causing organ compromise.  Clinically patient does not have any palpable adenopathy or B symptoms.  I recommend getting a PET CT scan to assess the extent of adenopathy as well as splenomegaly.  I will see her back after PET/CT scan results are back to discuss further management. Labs cbc with diff, cmp and ldh today   Thank you for this kind referral and the opportunity to participate in the care of this patient   Visit Diagnosis 1. Follicular lymphoma grade I of intrathoracic lymph nodes (HCC)     Dr. Owens Shark, MD, MPH [facility] [phone] 06/21/2023

## 2023-06-23 ENCOUNTER — Ambulatory Visit
Admission: RE | Admit: 2023-06-23 | Discharge: 2023-06-23 | Disposition: A | Discharge status: Home / Self Care | Payer: PPO | Source: Ambulatory Visit | Attending: Oncology | Admitting: Oncology

## 2023-06-23 DIAGNOSIS — C8200 Follicular lymphoma grade I, unspecified site: Secondary | ICD-10-CM | POA: Diagnosis not present

## 2023-06-23 DIAGNOSIS — C8202 Follicular lymphoma grade I, intrathoracic lymph nodes: Secondary | ICD-10-CM | POA: Insufficient documentation

## 2023-06-23 LAB — GLUCOSE, CAPILLARY: Glucose-Capillary: 103 mg/dL — ABNORMAL HIGH (ref 70–99)

## 2023-06-23 MED ORDER — FLUDEOXYGLUCOSE F - 18 (FDG) INJECTION
6.3000 | Freq: Once | INTRAVENOUS | Status: AC | PRN
  Administered 2023-06-23: 6.62 via INTRAVENOUS

## 2023-07-01 ENCOUNTER — Inpatient Hospital Stay: Payer: PPO

## 2023-07-01 ENCOUNTER — Encounter: Payer: Self-pay | Admitting: Oncology

## 2023-07-01 ENCOUNTER — Inpatient Hospital Stay: Payer: PPO | Attending: Oncology | Admitting: Oncology

## 2023-07-01 VITALS — BP 125/62 | HR 73 | Temp 97.4°F | Resp 18 | Wt 128.3 lb

## 2023-07-01 DIAGNOSIS — Z803 Family history of malignant neoplasm of breast: Secondary | ICD-10-CM | POA: Insufficient documentation

## 2023-07-01 DIAGNOSIS — Z79899 Other long term (current) drug therapy: Secondary | ICD-10-CM | POA: Diagnosis not present

## 2023-07-01 DIAGNOSIS — Z87891 Personal history of nicotine dependence: Secondary | ICD-10-CM | POA: Diagnosis not present

## 2023-07-01 DIAGNOSIS — C8202 Follicular lymphoma grade I, intrathoracic lymph nodes: Secondary | ICD-10-CM | POA: Insufficient documentation

## 2023-07-01 LAB — HEPATITIS B SURFACE ANTIGEN: Hepatitis B Surface Ag: NONREACTIVE

## 2023-07-01 LAB — HEPATITIS B CORE ANTIBODY, IGM: Hep B C IgM: NONREACTIVE

## 2023-07-02 ENCOUNTER — Other Ambulatory Visit: Payer: Self-pay | Admitting: *Deleted

## 2023-07-02 ENCOUNTER — Encounter: Payer: Self-pay | Admitting: *Deleted

## 2023-07-02 ENCOUNTER — Telehealth: Payer: Self-pay | Admitting: *Deleted

## 2023-07-02 DIAGNOSIS — C8202 Follicular lymphoma grade I, intrathoracic lymph nodes: Secondary | ICD-10-CM

## 2023-07-02 LAB — HEPATITIS B SURFACE ANTIBODY, QUANTITATIVE: Hep B S AB Quant (Post): <3.5 m[IU]/mL — ABNORMAL LOW

## 2023-07-02 NOTE — Progress Notes (Signed)
Hematology/Oncology Consult note [facility]  Telephone:([phone] [phone]  Patient Care Team: Marisue Ivan, MD as PCP - General (Family Medicine) Marisue Ivan, MD as Referring Physician (Family Medicine) Earline Mayotte, MD (General Surgery)   Name of the patient: Nicole York  [id number]  1951-01-21   Date of visit: 07/02/23  Diagnosis- Grade I follicular lymphoma likely stage IV  Chief complaint/ Reason for visit- discuss PET CT scan results and further management  Heme/Onc history: Patient is a 73 year old female with a past medical history signal taken for hyperlipidemia osteopenia IBS who underwent CT chest as a part of lung cancer screening program which incidentally showed a 9 mm nodule in the lateral aspect of the left breast which was more conspicuous as compared to what was noted on CT in October 2023 when it was 6 mm.  This was followed by a bilateral diagnostic mammogram which showed that this 9 mm mass appears to be an intramammary lymph node in the left upper outer breast.  There were no suspicious breast masses noted in the left breast.  No findings suggestive of malignancy in the right breast.  Patient underwent core biopsy of this intramammary lymph node which showed atypical nodular B-cell infiltrate favoring a lymphoproliferative disorder.   THE SPECIMEN CONSISTS OF FRAGMENTED CORES OF LYMPHOID TISSUE WITH A      NODULAR ARCHITECTURE CONSISTING OF AN MOSTLY  UNPOLARIZED FOLLICLES BUT WITH      RARE TINGIBLE BODY MACROPHAGE.  CD20 HIGHLIGHTS THE FOLLICLES AS WELL AS      PARAFOLLICULAR SMALL B CELL WHICH ARE ALSO PREDOMINANTLY CD10 AND BCL6 POSITIVE.      BCL-2 IS ALSO POSITIVE IN THE MAJORITY OF THE CELLS, BUT IS FOCALLY WEAK/ABSENT      IN AREAS OF THE FOLLICLES.  CD23 HIGHLIGHTS AND EXPANDED FOLLICULAR DENDRITIC      CELL MESHWORK AS WELL AS POSITIVITY ON THE B CELLS.  THE PROLIFERATION RATE BY      KI-67 IS  LOW (LESS THAN 5%) AND IS ABNORMALLY LOW IN THE AREA OF FOLLICLES.      CD3/CD5/CD43 HIGHLIGHTS SMALL BACKGROUND T CELLS.  CYCLIN D1 IS NEGATIVE.  FLOW      CYTOMETRY SHOWS A DUAL POPULATION OF B CELLS WITH A SUBSET SHOWING POLYCLONAL B      CELLS AND ANOTHER SUBSET THAT IS CD10 POSITIVE AND OVERALL APPEARS DIM FOR      LAMBDA BUT NOT DEFINITIVELY PATHOGNOMONIC FOR LAMBDA RESTRICTION.  THE OVERALL      FINDINGS ARE ATYPICAL AND FAVOR A LYMPHOPROLIFERATIVE DISORDER WITH A      DIFFERENTIAL DIAGNOSIS INCLUDING BOTH A FOLLICULAR LYMPHOMA (FAVORED) AS WELL AS      A MARGINAL ZONE LYMPHOMA WITH FOLLICULAR COLONIZATION.  TO FURTHER      ASSESS/CHARACTERIZE THE LESION MOLECULAR TESTING FOR AN IGH GENE REARRANGEMENT      AS WELL AS LOW-GRADE B-CELL LYMPHOMA.  FISH studies are currently pending    Interval history- patient is doing well and denies any complaints at this time  ECOG PS- 0 Pain scale- 0   Review of systems- Review of Systems  Constitutional:  Negative for chills, fever, malaise/fatigue and weight loss.  HENT:  Negative for congestion, ear discharge and nosebleeds.   Eyes:  Negative for blurred vision.  Respiratory:  Negative for cough, hemoptysis, sputum production, shortness of breath and wheezing.   Cardiovascular:  Negative for chest pain, palpitations, orthopnea and claudication.  Gastrointestinal:  Negative for  abdominal pain, blood in stool, constipation, diarrhea, heartburn, melena, nausea and vomiting.  Genitourinary:  Negative for dysuria, flank pain, frequency, hematuria and urgency.  Musculoskeletal:  Negative for back pain, joint pain and myalgias.  Skin:  Negative for rash.  Neurological:  Negative for dizziness, tingling, focal weakness, seizures, weakness and headaches.  Endo/Heme/Allergies:  Does not bruise/bleed easily.  Psychiatric/Behavioral:  Negative for depression and suicidal ideas. The patient does not have insomnia.       No Known Allergies   Past  Medical History:  Diagnosis Date   Allergy    Depression    Eczematous dermatitis of eyelid    History of chicken pox    Hyperlipidemia    IBS (irritable bowel syndrome)    Osteopenia      Past Surgical History:  Procedure Laterality Date   BREAST BIOPSY Left 06/18/2015   BENIGN BREAST TISSUE WITH STROMAL FIBROSIS AND FIBROCYSTIC CHANGE.    BREAST BIOPSY Right 11/30/2018   affirm bx   ribbon marker   neg   BREAST BIOPSY Left 06/05/2023   [place] bx 2:00 9 cmfn, heart marker, path pending   BREAST BIOPSY Left 06/05/2023   [place] LT BREAST BX W LOC DEV 1ST LESION IMG BX SPEC US GUIDE 06/05/2023 [facility]   BREAST EXCISIONAL BIOPSY Right 1989   COLONOSCOPY WITH PROPOFOL N/A 09/18/2015   Procedure: COLONOSCOPY WITH PROPOFOL;  Surgeon: Christena Deem, MD;  Location: [facility] ENDOSCOPY;  Service: Endoscopy;  Laterality: N/A;   COLONOSCOPY WITH PROPOFOL N/A 10/02/2021   Procedure: COLONOSCOPY WITH PROPOFOL;  Surgeon: Toledo, Boykin Nearing, MD;  Location: [facility] ENDOSCOPY;  Service: Gastroenterology;  Laterality: N/A;   ESOPHAGOGASTRODUODENOSCOPY (EGD) WITH PROPOFOL N/A 10/02/2021   Procedure: ESOPHAGOGASTRODUODENOSCOPY (EGD) WITH PROPOFOL;  Surgeon: Toledo, Boykin Nearing, MD;  Location: [facility] ENDOSCOPY;  Service: Gastroenterology;  Laterality: N/A;   TONSILLECTOMY     TUBAL LIGATION      Social History   Socioeconomic History   Marital status: Married    Spouse name: Not on file   Number of children: Not on file   Years of education: Not on file   Highest education level: Not on file  Occupational History   Not on file  Tobacco Use   Smoking status: Former    Current packs/day: 0.00    Average packs/day: 1 pack/day for 43.0 years (43.0 ttl pk-yrs)    Types: Cigarettes    Start date: 05/05/1971    Quit date: 05/04/2014    Years since quitting: 9.1   Smokeless tobacco: Never  Vaping Use   Vaping status: Never Used  Substance and Sexual Activity   Alcohol use: Not Currently    Comment:  socially   Drug use: No   Sexual activity: Not on file  Other Topics Concern   Not on file  Social History Narrative   Not on file   Social Drivers of Health   Financial Resource Strain: Low Risk  (03/04/2023)   [facility]   Overall Financial Resource Strain (CARDIA)    Difficulty of Paying Living Expenses: Not hard at all  Food Insecurity: No Food Insecurity (06/16/2023)   Hunger Vital Sign    Worried About Running Out of Food in the Last Year: Never true    Ran Out of Food in the Last Year: Never true  Transportation Needs: No Transportation Needs (06/16/2023)   PRAPARE - Administrator, Civil Service (Medical): No    Lack of Transportation (Non-Medical):  No  Physical Activity: Not on file  Stress: Not on file  Social Connections: Not on file  Intimate Partner Violence: Not At Risk (06/16/2023)   Humiliation, Afraid, Rape, and Kick questionnaire    Fear of Current or Ex-Partner: No    Emotionally Abused: No    Physically Abused: No    Sexually Abused: No    Family History  Problem Relation Age of Onset   Breast cancer Mother    Hyperlipidemia Mother    Cancer Mother 67       breast   Mental illness Mother        Alzheimers Dementia   Hyperlipidemia Father    Hypertension Father      Current Outpatient Medications:    alendronate (FOSAMAX) 70 MG tablet, TAKE 1 TABLET (70 MG TOTAL) BY MOUTH EVERY 7 (SEVEN) DAYS. (Patient not taking: Reported on 06/16/2023), Disp: , Rfl: 5   Ascorbic Acid (VITAMIN C) 1000 MG tablet, Take 1,000 mg by mouth daily., Disp: , Rfl:    aspirin 81 MG EC tablet, Take 325 mg by mouth daily.  , Disp: , Rfl:    atorvastatin (LIPITOR) 40 MG tablet, Take 1 tablet by mouth at bedtime., Disp: , Rfl:    Biotin 1 MG CAPS, Take by mouth. (Patient not taking: Reported on 06/16/2023), Disp: , Rfl:    Calcium Citrate-Vitamin D (CITRACAL + D PO), Take by mouth daily at 8 pm., Disp: , Rfl:    cetirizine (ZYRTEC) 10  MG tablet, Take 10 mg by mouth daily.  , Disp: , Rfl:    chlorthalidone (HYGROTON) 25 MG tablet, Take 1 tablet by mouth daily., Disp: , Rfl:    cholecalciferol (VITAMIN D) 1000 UNITS tablet, Take 1,000 Units by mouth daily.  , Disp: , Rfl:    cyanocobalamin (VITAMIN B12) 1000 MCG tablet, Take 1,000 mcg by mouth daily., Disp: , Rfl:    escitalopram (LEXAPRO) 5 MG tablet, Take 1 tablet (5 mg total) by mouth at bedtime., Disp: 30 tablet, Rfl: 0   ferrous sulfate 324 (65 Fe) MG TBEC, Take by mouth., Disp: , Rfl:    fish oil-omega-3 fatty acids 1000 MG capsule, Take 2 g by mouth daily.  , Disp: , Rfl:    fluticasone (FLONASE) 50 MCG/ACT nasal spray, USE 2 SPRAY IN EACH NOSTRIL EVERY DAY, Disp: 16 g, Rfl: 0   MULTIPLE VITAMIN PO, Take by mouth.  , Disp: , Rfl:    Multiple Vitamins-Minerals (MULTIVITAMIN ADULTS) TABS, Take by mouth., Disp: , Rfl:    traZODone (DESYREL) 50 MG tablet, Take by mouth., Disp: , Rfl:    vitamin E 400 UNIT capsule, Take 400 Units by mouth daily.  , Disp: , Rfl:    Zinc 50 MG TABS, Take by mouth daily at 8 pm. (Patient not taking: Reported on 06/16/2023), Disp: , Rfl:    zolpidem (AMBIEN) 5 MG tablet, Take 5 mg by mouth at bedtime as needed for sleep. (Patient not taking: Reported on 06/16/2023), Disp: , Rfl:   Physical exam:  Vitals:   07/01/23 1440  BP: 125/62  Pulse: 73  Resp: 18  Temp: (!) 97.4 F (36.3 C)  TempSrc: Tympanic  SpO2: 99%  Weight: 128 lb 4.8 oz (58.2 kg)   Physical Exam Cardiovascular:     Rate and Rhythm: Normal rate and regular rhythm.     Heart sounds: Normal heart sounds.  Pulmonary:     Effort: Pulmonary effort is normal.     Breath sounds:  Normal breath sounds.  Skin:    General: Skin is warm and dry.  Neurological:     Mental Status: She is alert and oriented to person, place, and time.         Latest Ref Rng & Units 06/16/2023    3:50 PM  CMP  Glucose 70 - 99 mg/dL 94   BUN 8 - 23 mg/dL 16   Creatinine 4.09 - 1.00 mg/dL 8.11    Sodium 914 - 782 mmol/L 132   Potassium 3.5 - 5.1 mmol/L 3.1   Chloride 98 - 111 mmol/L 95   CO2 22 - 32 mmol/L 26   Calcium 8.9 - 10.3 mg/dL 9.8   Total Protein 6.5 - 8.1 g/dL 7.4   Total Bilirubin 0.0 - 1.2 mg/dL 0.9   Alkaline Phos 38 - 126 U/L 75   AST 15 - 41 U/L 41   ALT 0 - 44 U/L 38       Latest Ref Rng & Units 06/16/2023    3:49 PM  CBC  WBC 4.0 - 10.5 K/uL 7.6   Hemoglobin 12.0 - 15.0 g/dL 95.6   Hematocrit 21.3 - 46.0 % 43.9   Platelets 150 - 400 K/uL 288     No images are attached to the encounter.  NM PET Image Initial (PI) Skull Base To Thigh Result Date: 07/01/2023 CLINICAL DATA:  Follicular lymphoma grade 1. EXAM: NUCLEAR MEDICINE PET SKULL BASE TO THIGH TECHNIQUE: 6.62 mCi F-18 FDG was injected intravenously. Full-ring PET imaging was performed from the skull base to thigh after the radiotracer. CT data was obtained and used for attenuation correction and anatomic localization. Fasting blood glucose: 103 mg/dl COMPARISON:  Chest CT 08/65/7846 and older. Breast workup January 2025. FINDINGS: Mediastinal blood pool activity: SUV max 2.5 Liver activity: SUV max 2.3 NECK: Few small lymph nodes identified on the left posteriorly with areas of abnormal radiotracer uptake. This includes focus with maximum SUV of 3.7 corresponding to a lymph node on series 6, image 11 measuring 8 by 5 mm. Additional hypermetabolic node just caudal to this is also small corresponding to lesion on series 6 image 14 measuring 4 mm in short axis. Third lesion more caudal in the posterior neck on series 6, image 18 also is mildly hypermetabolic. Few tiny areas on the right side of neck as well posteriorly. Otherwise no additional areas of abnormal radiotracer uptake above blood pool along the lymph node chains of the internal jugular and submandibular regions. Physiologic uptake along the vocal cords. Incidental CT findings: Paranasal sinuses are grossly clear. Degenerative changes along the spine with  loss of normal cervical lordosis. Preserved thyroid gland, submandibular glands and parotid glands. Preserved intracranial compartment is preserved. CHEST: No abnormal radiotracer uptake above mediastinal blood pool in the axillary regions on the right, hilum or mediastinum. There are at least 3 mildly hypermetabolic nodes in the left axillary region. Example has maximum SUV value 2.1 and is seen corresponding to node on series 6, image 40 measuring 14 x 6 mm. There are several areas of abnormal uptake however along the skeleton. This includes area right humeral head with maximum SUV value of 7.1. This area as area of sclerosis on corresponding CT series 6, image 28. Smaller area of uptake along the left humeral head. There also several areas elsewhere including along the manubrium, sternum, multiple ribs bilaterally. There is also areas along the spine including proximally the T5 and T7 levels. Maximum SUV value of the T7 focus for  example measures 4.1. Incidental CT findings: Grossly on the noncontrast 10 UA shin correction CT, heart is nonenlarged. Trace pericardial fluid. Coronary artery calcifications are seen. The thoracic aorta has a normal course and caliber. Preserved thyroid gland. Normal caliber thoracic esophagus. Mild breathing motion. Dependent areas of scarring atelectatic changes. No pleural effusion. ABDOMEN/PELVIS: There is physiologic distribution radiotracer along the parenchymal organs, renal collecting system and bowel. There are several areas of abnormal uptake identified along the skeleton including throughout the sacrum particularly central and left superiorly, the adjacent iliac bones, left greater than right. Maximum SUV value along the upper sacrum measures 6.2. Left hemi sacral area 4.5 maximum SUV. There is an additional area involving the L3 vertebral body. Incidental CT findings: With the limits of the attenuation correction noncontrast CT, grossly the liver, spleen, pancreas, adrenal  glands are unremarkable. No abnormal calcifications are seen within either kidney nor along the course of either ureter. Preserved contour to the urinary bladder. Large bowel has a normal course and caliber with moderate colonic stool. Few left-sided colonic diverticula. Normal retrocecal appendix in the right hemipelvis. Stomach and small bowel are nondilated. Prominent vascular calcifications along the aorta and branch vessels. Gallbladder is present. Prominent degenerative changes of the spine and pelvis. IMPRESSION: Multifocal areas of abnormal uptake along the skeleton including specifically areas such as multiple ribs, sternum, multiple vertebral bodies, right humeral head. There is some mild abnormal uptake as well along small nodes in the posterior aspect of the neck, left greater than right and the left axillary region. Minimal uptake along the area of nodularity along the lateral aspect of the left breast. Please correlate with mammographic workup. Moderate colonic stool.  Colonic diverticula. Electronically Signed   By: Karen Kays M.D.   On: 07/01/2023 14:41   [place] LT BREAST BX W LOC DEV 1ST LESION IMG BX SPEC US GUIDE Addendum Date: 06/11/2023 ADDENDUM REPORT: 06/11/2023 11:21 ADDENDUM: PATHOLOGY revealed: 1. Lymph node, needle/core biopsy, Left breast 2 o'clock, 9 cmfn, IM node, heart clip : - ATYPICAL NODULAR B CELL INFILTRATE, FAVOR A LYMPHOPROLIFERATIVE DISORDER. NOTE: THE SPECIMEN CONSISTS OF FRAGMENTED CORES OF LYMPHOID TISSUE WITH A NODULAR ARCHITECTURE CONSISTING OF AN MOSTLY UNPOLARIZED FOLLICLES BUT WITH RARE TINGIBLE BODY MACROPHAGE. CD20 HIGHLIGHTS THE FOLLICLES AS WELL AS PARAFOLLICULAR SMALL B CELL WHICH ARE ALSO PREDOMINANTLY CD10 AND BCL6 POSITIVE. BCL-2 IS ALSO POSITIVE IN THE MAJORITY OF THE CELLS, BUT IS FOCALLY WEAK/ABSENT IN AREAS OF THE FOLLICLES. CD23 HIGHLIGHTS AND EXPANDED FOLLICULAR DENDRITIC CELL MESHWORK AS WELL AS POSITIVITY ON THE B CELLS. THE PROLIFERATION RATE BY KI-67  IS LOW (LESS THAN 5%) AND IS ABNORMALLY LOW IN THE AREA OF FOLLICLES. CD3/CD5/CD43 HIGHLIGHTS SMALL BACKGROUND T CELLS. CYCLIN D1 IS NEGATIVE. FLOW CYTOMETRY SHOWS A DUAL POPULATION OF B CELLS WITH A SUBSET SHOWING POLYCLONAL B CELLS AND ANOTHER SUBSET THAT IS CD10 POSITIVE AND OVERALL APPEARS DIM FOR LAMBDA BUT NOT DEFINITIVELY PATHOGNOMONIC FOR LAMBDA RESTRICTION. THE OVERALL FINDINGS ARE ATYPICAL AND FAVOR A LYMPHOPROLIFERATIVE DISORDER WITH A DIFFERENTIAL DIAGNOSIS INCLUDING BOTH A FOLLICULAR LYMPHOMA (FAVORED) AS WELL AS A MARGINAL ZONE LYMPHOMA WITH FOLLICULAR COLONIZATION. TO FURTHER ASSESS/CHARACTERIZE THE LESION MOLECULAR TESTING FOR AN IGH GENE REARRANGEMENT AS WELL AS LOW-GRADE B-CELL LYMPHOMA FISH ARE PENDING AND WILL BE REPORTED IN ADDENDUM. Pathology results are CONCORDANT with imaging findings, per Dr. Meda Klinefelter. Randa Lynn RN spoke with patient on 06/10/2023, and patient reported biopsy site doing well without adverse symptoms. Post biopsy care instructions were reviewed, questions were answered and my  direct phone number was provided. Patient was instructed to call [facility] for any additional questions or concerns related to biopsy site. RECOMMENDATION: Please refer patient to Hematologist Oncologist. Provider, Dr. Marisue Ivan, requested that patient meet with him to discuss biopsy results and recommendations. Patient has appointment with provider on 06/10/2023 to discuss biopsy results. Pathology results reported by Randa Lynn RN on 06/11/2023. Electronically Signed   By: Meda Klinefelter M.D.   On: 06/11/2023 11:21   Result Date: 06/11/2023 CLINICAL DATA:  Enlargement of a LEFT breast intramammary lymph node EXAM: ULTRASOUND GUIDED LEFT BREAST CORE NEEDLE BIOPSY COMPARISON:  Previous exam(s). PROCEDURE: I met with the patient and we discussed the procedure of ultrasound-guided biopsy, including benefits and alternatives. We discussed the high likelihood of a successful  procedure. We discussed the risks of the procedure, including infection, bleeding, tissue injury, clip migration, and inadequate sampling. Informed written consent was given. The usual time-out protocol was performed immediately prior to the procedure. Lesion quadrant: Upper outer quadrant Using sterile technique and 1% lidocaine and 2% lidocaine with epinephrine as local anesthetic, under direct ultrasound visualization, a 14 gauge spring-loaded device was used to perform biopsy of an intramammary lymph node at 2 o'clock 9 cm from the nipple using a lateral approach. At the conclusion of the procedure a heart shaped tissue marker clip was deployed into the biopsy cavity. Follow up 2 view mammogram was performed and dictated separately. IMPRESSION: Ultrasound guided biopsy of an intramammary lymph node. No apparent complications. Electronically Signed: By: Meda Klinefelter M.D. On: 06/05/2023 09:38   MM CLIP PLACEMENT LEFT Result Date: 06/05/2023 CLINICAL DATA:  Status post ultrasound-guided biopsy EXAM: 3D DIAGNOSTIC LEFT MAMMOGRAM POST ULTRASOUND BIOPSY COMPARISON:  Previous exam(s). FINDINGS: 3D Mammographic images were obtained following ultrasound guided biopsy of an intramammary lymph node. The heart biopsy marking clip is in expected position at the site of biopsy. IMPRESSION: Appropriate positioning of the heart shaped biopsy marking clip at the site of biopsy in the upper outer breast. Final Assessment: Post Procedure Mammograms for Marker Placement Electronically Signed   By: Meda Klinefelter M.D.   On: 06/05/2023 09:51     Assessment and plan- Patient is a 73 y.o. female diagnosed follicular lymphoma here to discuss PET CT scan results and further management  Patient was incidentally found to have gradually increase in size of an intramammary lymph node which was picked up on a mammogram which had grown from a prior size of 6 mm to 9 mm.  This was biopsy-proven low-grade B-cell  lymphoproliferative disorder likely consistent with follicular lymphoma with a low Ki-67 of 5%.  Marginal zone lymphoma was in the differential.  B-cell clonality testing and FISH studies showed IgH/Bcl-2 consistent with follicular lymphoma.  She underwent PET CT scan to complete her staging workup which incidentally shows several areas of abnormal uptake along the skeleton including the right humeral head with an SUV of 7.1 as well as uptake in manubrium, sternum, multiple ribs bilaterally, uptake in the spine at T5 and T7 levels as well as sacrum and adjacent iliac bones. Interestingly patient did not have any significant area of lymph node involvement other than small nodes in the posterior neck and left axillary region.  I would like to get bone biopsy to confirm that the bone involvement is from follicular lymphoma as it would be somewhat atypical do not see significant adenopathy but significant bone involvement in follicular lymphoma.  Although she does not have bulky disease I am  concerned about possibility of pathologic fractures in the setting of bone involvement and would like to offer treatment instead of wait full watching for follicular lymphoma.  Given that she is entirely asymptomatic, I am inclined to offer 4 weekly cycles of Rituxan followed by repeat scans.  If she does not respond favorably to single agent Rituxan we could consider Bendamustine Rituxan combination treatment for her in the future.  I will get baseline hepatitis B testing today and discuss her case at tumor board next week and see her back tentatively in 7 to 10 days time to discuss bone biopsy results and further management  Discussed risks and benefits of Rituxan including all but not limited to nausea vomiting low blood counts and risk of infusion reaction.  Treatment will be given with an intent to control the disease but not cure as follicular lymphoma can relapse despite treatment. Visit Diagnosis 1. Follicular  lymphoma grade I of intrathoracic lymph nodes (HCC)      Dr. Owens Shark, MD, MPH [facility] [phone] 07/02/2023 8:34 PM

## 2023-07-02 NOTE — Telephone Encounter (Signed)
RN placed call to patient to follow up on appointment for biopsy. Patient is scheduled for Wednesday 2/19 at 1:00 pm, patient to arrive at 12:00 pm. Patient will check in at the Heart and Vascular entrance. Patient advised she will need transportation to and from appointment. Patient verbalized understanding, mychart message sent also with details.

## 2023-07-07 ENCOUNTER — Other Ambulatory Visit: Payer: Self-pay | Admitting: Radiology

## 2023-07-07 DIAGNOSIS — C8290 Follicular lymphoma, unspecified, unspecified site: Secondary | ICD-10-CM

## 2023-07-08 ENCOUNTER — Other Ambulatory Visit: Payer: Self-pay

## 2023-07-08 ENCOUNTER — Ambulatory Visit
Admission: RE | Admit: 2023-07-08 | Discharge: 2023-07-08 | Disposition: A | Discharge status: Home / Self Care | Payer: PPO | Source: Ambulatory Visit | Attending: Oncology | Admitting: Oncology

## 2023-07-08 DIAGNOSIS — C8290 Follicular lymphoma, unspecified, unspecified site: Secondary | ICD-10-CM

## 2023-07-08 DIAGNOSIS — C8202 Follicular lymphoma grade I, intrathoracic lymph nodes: Secondary | ICD-10-CM | POA: Diagnosis not present

## 2023-07-08 DIAGNOSIS — Z87891 Personal history of nicotine dependence: Secondary | ICD-10-CM | POA: Insufficient documentation

## 2023-07-08 DIAGNOSIS — M899 Disorder of bone, unspecified: Secondary | ICD-10-CM | POA: Diagnosis not present

## 2023-07-08 DIAGNOSIS — K5730 Diverticulosis of large intestine without perforation or abscess without bleeding: Secondary | ICD-10-CM | POA: Diagnosis not present

## 2023-07-08 DIAGNOSIS — M898X8 Other specified disorders of bone, other site: Secondary | ICD-10-CM | POA: Diagnosis not present

## 2023-07-08 LAB — CBC WITH DIFFERENTIAL/PLATELET
Abs Immature Granulocytes: 0.01 10*3/uL (ref 0.00–0.07)
Basophils Absolute: 0 10*3/uL (ref 0.0–0.1)
Basophils Relative: 1 %
Eosinophils Absolute: 0.1 10*3/uL (ref 0.0–0.5)
Eosinophils Relative: 1 %
HCT: 43.8 % (ref 36.0–46.0)
Hemoglobin: 15.1 g/dL — ABNORMAL HIGH (ref 12.0–15.0)
Immature Granulocytes: 0 %
Lymphocytes Relative: 30 %
Lymphs Abs: 1.8 10*3/uL (ref 0.7–4.0)
MCH: 30.2 pg (ref 26.0–34.0)
MCHC: 34.5 g/dL (ref 30.0–36.0)
MCV: 87.6 fL (ref 80.0–100.0)
Monocytes Absolute: 0.5 10*3/uL (ref 0.1–1.0)
Monocytes Relative: 8 %
Neutro Abs: 3.6 10*3/uL (ref 1.7–7.7)
Neutrophils Relative %: 60 %
Platelets: 265 10*3/uL (ref 150–400)
RBC: 5 MIL/uL (ref 3.87–5.11)
RDW: 13.9 % (ref 11.5–15.5)
WBC: 5.9 10*3/uL (ref 4.0–10.5)
nRBC: 0 % (ref 0.0–0.2)

## 2023-07-08 MED ORDER — MIDAZOLAM HCL 2 MG/2ML IJ SOLN
INTRAMUSCULAR | Status: AC
  Filled 2023-07-08: qty 2

## 2023-07-08 MED ORDER — FENTANYL CITRATE (PF) 100 MCG/2ML IJ SOLN
INTRAMUSCULAR | Status: AC
  Filled 2023-07-08: qty 2

## 2023-07-08 MED ORDER — MIDAZOLAM HCL 2 MG/2ML IJ SOLN
INTRAMUSCULAR | Status: AC | PRN
  Administered 2023-07-08: 1 mg via INTRAVENOUS
  Administered 2023-07-08: .5 mg via INTRAVENOUS

## 2023-07-08 MED ORDER — SODIUM CHLORIDE 0.9 % IV SOLN: INTRAVENOUS | Status: DC

## 2023-07-08 MED ORDER — LIDOCAINE 1 % OPTIME INJ - NO CHARGE
10.0000 mL | Freq: Once | INTRAMUSCULAR | Status: AC
Start: 2023-07-08 — End: 2023-07-08
  Administered 2023-07-08: 10 mL via SUBCUTANEOUS
  Filled 2023-07-08: qty 10

## 2023-07-08 MED ORDER — FENTANYL CITRATE (PF) 100 MCG/2ML IJ SOLN
INTRAMUSCULAR | Status: AC | PRN
  Administered 2023-07-08: 25 ug via INTRAVENOUS
  Administered 2023-07-08: 50 ug via INTRAVENOUS

## 2023-07-08 NOTE — H&P (Signed)
Chief Complaint: Patient was seen in consultation today for Lymphoma; bone lesion biopsy   Referring Physician(s): Rao,Archana C  Supervising Physician: Malachy Moan  Patient Status: [facility] - Out-pt  History of Present Illness: Nicole York is a 73 y.o. female with a medical history significant for IBS, depression, and osteopenia. She underwent a CT chest as part of a lung cancer screening program and this had an incidental finding of a left breast nodule. A diagnostic mammogram revealed this nodule to be an intramammary lymph node. A core biopsy was positive for B-cell infiltrate favoring a lymphoproliferative disorder.    A PET scan showed several areas of abnormal uptake in the skeleton and her Oncology team is requesting a bone lesion biopsy for further work up.   NM PET 06/23/23  IMPRESSION: 1. Multifocal areas of abnormal uptake along the skeleton including specifically areas such as multiple ribs, sternum, multiple vertebral bodies, right humeral head. 2. There is some mild abnormal uptake as well along small nodes in the posterior aspect of the neck, left greater than right and the left axillary region. Minimal uptake along the area of nodularity along the lateral aspect of the left breast. Please correlate with mammographic workup. 3. Moderate colonic stool.  Colonic diverticula.  Imaging reviewed by Dr. Archer Asa who approved patient for image-guided biopsy of the left iliac bone lesion.   Past Medical History:  Diagnosis Date   Allergy    Depression    Eczematous dermatitis of eyelid    History of chicken pox    Hyperlipidemia    IBS (irritable bowel syndrome)    Osteopenia     Past Surgical History:  Procedure Laterality Date   BREAST BIOPSY Left 06/18/2015   BENIGN BREAST TISSUE WITH STROMAL FIBROSIS AND FIBROCYSTIC CHANGE.    BREAST BIOPSY Right 11/30/2018   affirm bx   ribbon marker   neg   BREAST BIOPSY Left 06/05/2023   [place] bx 2:00 9 cmfn,  heart marker, path pending   BREAST BIOPSY Left 06/05/2023   [place] LT BREAST BX W LOC DEV 1ST LESION IMG BX SPEC US GUIDE 06/05/2023 [facility]   BREAST EXCISIONAL BIOPSY Right 1989   COLONOSCOPY WITH PROPOFOL N/A 09/18/2015   Procedure: COLONOSCOPY WITH PROPOFOL;  Surgeon: Christena Deem, MD;  Location: [facility] ENDOSCOPY;  Service: Endoscopy;  Laterality: N/A;   COLONOSCOPY WITH PROPOFOL N/A 10/02/2021   Procedure: COLONOSCOPY WITH PROPOFOL;  Surgeon: Toledo, Boykin Nearing, MD;  Location: [facility] ENDOSCOPY;  Service: Gastroenterology;  Laterality: N/A;   ESOPHAGOGASTRODUODENOSCOPY (EGD) WITH PROPOFOL N/A 10/02/2021   Procedure: ESOPHAGOGASTRODUODENOSCOPY (EGD) WITH PROPOFOL;  Surgeon: Toledo, Boykin Nearing, MD;  Location: [facility] ENDOSCOPY;  Service: Gastroenterology;  Laterality: N/A;   TONSILLECTOMY     TUBAL LIGATION      Allergies: Patient has no known allergies.  Medications: Prior to Admission medications   Medication Sig Start Date End Date Taking? Authorizing Provider  alendronate (FOSAMAX) 70 MG tablet TAKE 1 TABLET (70 MG TOTAL) BY MOUTH EVERY 7 (SEVEN) DAYS. Patient not taking: Reported on 06/16/2023 05/02/15   [provider]  Ascorbic Acid (VITAMIN C) 1000 MG tablet Take 1,000 mg by mouth daily.    [provider]  aspirin 81 MG EC tablet Take 325 mg by mouth daily.      [provider]  atorvastatin (LIPITOR) 40 MG tablet Take 1 tablet by mouth at bedtime. 06/11/23   [provider]  Biotin 1 MG CAPS Take by mouth. Patient not taking: Reported  on 06/16/2023    [provider]  Calcium Citrate-Vitamin D (CITRACAL + D PO) Take by mouth daily at 8 pm.    [provider]  cetirizine (ZYRTEC) 10 MG tablet Take 10 mg by mouth daily.      [provider]  chlorthalidone (HYGROTON) 25 MG tablet Take 1 tablet by mouth daily. 03/11/23   [provider]  cholecalciferol (VITAMIN D) 1000 UNITS tablet Take 1,000 Units by mouth  daily.      [provider]  cyanocobalamin (VITAMIN B12) 1000 MCG tablet Take 1,000 mcg by mouth daily.    [provider]  escitalopram (LEXAPRO) 5 MG tablet Take 1 tablet (5 mg total) by mouth at bedtime. 05/08/14   Sherlene Shams, MD  ferrous sulfate 324 (65 Fe) MG TBEC Take by mouth.    [provider]  fish oil-omega-3 fatty acids 1000 MG capsule Take 2 g by mouth daily.      [provider]  fluticasone (FLONASE) 50 MCG/ACT nasal spray USE 2 SPRAY IN EACH NOSTRIL EVERY DAY 06/23/13   Sherlene Shams, MD  MULTIPLE VITAMIN PO Take by mouth.      [provider]  Multiple Vitamins-Minerals (MULTIVITAMIN ADULTS) TABS Take by mouth.    [provider]  traZODone (DESYREL) 50 MG tablet Take by mouth. 03/26/23   [provider]  vitamin E 400 UNIT capsule Take 400 Units by mouth daily.      [provider]  Zinc 50 MG TABS Take by mouth daily at 8 pm. Patient not taking: Reported on 06/16/2023    [provider]  zolpidem (AMBIEN) 5 MG tablet Take 5 mg by mouth at bedtime as needed for sleep. Patient not taking: Reported on 06/16/2023    [provider]     Family History  Problem Relation Age of Onset   Breast cancer Mother    Hyperlipidemia Mother    Cancer Mother 25       breast   Mental illness Mother        Alzheimers Dementia   Hyperlipidemia Father    Hypertension Father     Social History   Socioeconomic History   Marital status: Married    Spouse name: Not on file   Number of children: Not on file   Years of education: Not on file   Highest education level: Not on file  Occupational History   Not on file  Tobacco Use   Smoking status: Former    Current packs/day: 0.00    Average packs/day: 1 pack/day for 43.0 years (43.0 ttl pk-yrs)    Types: Cigarettes    Start date: 05/05/1971    Quit date: 05/04/2014    Years since quitting: 9.1   Smokeless tobacco: Never  Vaping Use    Vaping status: Never Used  Substance and Sexual Activity   Alcohol use: Not Currently    Comment: socially   Drug use: No   Sexual activity: Not on file  Other Topics Concern   Not on file  Social History Narrative   Lives with Thereasa Distance, husband. No indoor pets.   Social Drivers of Corporate investment banker Strain: Low Risk  (03/04/2023)   Received from [facility]   Overall Financial Resource Strain (CARDIA)    Difficulty of Paying Living Expenses: Not hard at all  Food Insecurity: No Food Insecurity (06/16/2023)   Hunger Vital Sign    Worried About Running Out  of Food in the Last Year: Never true    Ran Out of Food in the Last Year: Never true  Transportation Needs: No Transportation Needs (06/16/2023)   PRAPARE - Administrator, Civil Service (Medical): No    Lack of Transportation (Non-Medical): No  Physical Activity: Not on file  Stress: Not on file  Social Connections: Not on file    Review of Systems: A 12 point ROS discussed and pertinent positives are indicated in the HPI above.  All other systems are negative.  Review of Systems  All other systems reviewed and are negative.   Vital Signs: BP 136/70   Pulse 61   Temp 97.9 F (36.6 C) (Oral)   Resp 15   Ht 5\' 2"  (1.575 m)   Wt 127 lb 1.6 oz (57.7 kg)   SpO2 98%   BMI 23.25 kg/m   Physical Exam Constitutional:      General: She is not in acute distress.    Appearance: She is not ill-appearing.  HENT:     Mouth/Throat:     Mouth: Mucous membranes are moist.     Pharynx: Oropharynx is clear.  Cardiovascular:     Rate and Rhythm: Normal rate.  Pulmonary:     Effort: Pulmonary effort is normal.  Skin:    General: Skin is warm and dry.  Neurological:     Mental Status: She is alert and oriented to person, place, and time.  Psychiatric:        Mood and Affect: Mood normal.        Behavior: Behavior normal.        Thought Content: Thought content normal.        Judgment:  Judgment normal.     Imaging: NM PET Image Initial (PI) Skull Base To Thigh Result Date: 07/01/2023 CLINICAL DATA:  Follicular lymphoma grade 1. EXAM: NUCLEAR MEDICINE PET SKULL BASE TO THIGH TECHNIQUE: 6.62 mCi F-18 FDG was injected intravenously. Full-ring PET imaging was performed from the skull base to thigh after the radiotracer. CT data was obtained and used for attenuation correction and anatomic localization. Fasting blood glucose: 103 mg/dl COMPARISON:  Chest CT 16/02/9603 and older. Breast workup January 2025. FINDINGS: Mediastinal blood pool activity: SUV max 2.5 Liver activity: SUV max 2.3 NECK: Few small lymph nodes identified on the left posteriorly with areas of abnormal radiotracer uptake. This includes focus with maximum SUV of 3.7 corresponding to a lymph node on series 6, image 11 measuring 8 by 5 mm. Additional hypermetabolic node just caudal to this is also small corresponding to lesion on series 6 image 14 measuring 4 mm in short axis. Third lesion more caudal in the posterior neck on series 6, image 18 also is mildly hypermetabolic. Few tiny areas on the right side of neck as well posteriorly. Otherwise no additional areas of abnormal radiotracer uptake above blood pool along the lymph node chains of the internal jugular and submandibular regions. Physiologic uptake along the vocal cords. Incidental CT findings: Paranasal sinuses are grossly clear. Degenerative changes along the spine with loss of normal cervical lordosis. Preserved thyroid gland, submandibular glands and parotid glands. Preserved intracranial compartment is preserved. CHEST: No abnormal radiotracer uptake above mediastinal blood pool in the axillary regions on the right, hilum or mediastinum. There are at least 3 mildly hypermetabolic nodes in the left axillary region. Example has maximum SUV value 2.1 and is seen corresponding to node on series 6, image 40 measuring 14 x 6 mm.  There are several areas of abnormal uptake  however along the skeleton. This includes area right humeral head with maximum SUV value of 7.1. This area as area of sclerosis on corresponding CT series 6, image 28. Smaller area of uptake along the left humeral head. There also several areas elsewhere including along the manubrium, sternum, multiple ribs bilaterally. There is also areas along the spine including proximally the T5 and T7 levels. Maximum SUV value of the T7 focus for example measures 4.1. Incidental CT findings: Grossly on the noncontrast 10 UA shin correction CT, heart is nonenlarged. Trace pericardial fluid. Coronary artery calcifications are seen. The thoracic aorta has a normal course and caliber. Preserved thyroid gland. Normal caliber thoracic esophagus. Mild breathing motion. Dependent areas of scarring atelectatic changes. No pleural effusion. ABDOMEN/PELVIS: There is physiologic distribution radiotracer along the parenchymal organs, renal collecting system and bowel. There are several areas of abnormal uptake identified along the skeleton including throughout the sacrum particularly central and left superiorly, the adjacent iliac bones, left greater than right. Maximum SUV value along the upper sacrum measures 6.2. Left hemi sacral area 4.5 maximum SUV. There is an additional area involving the L3 vertebral body. Incidental CT findings: With the limits of the attenuation correction noncontrast CT, grossly the liver, spleen, pancreas, adrenal glands are unremarkable. No abnormal calcifications are seen within either kidney nor along the course of either ureter. Preserved contour to the urinary bladder. Large bowel has a normal course and caliber with moderate colonic stool. Few left-sided colonic diverticula. Normal retrocecal appendix in the right hemipelvis. Stomach and small bowel are nondilated. Prominent vascular calcifications along the aorta and branch vessels. Gallbladder is present. Prominent degenerative changes of the spine and  pelvis. IMPRESSION: Multifocal areas of abnormal uptake along the skeleton including specifically areas such as multiple ribs, sternum, multiple vertebral bodies, right humeral head. There is some mild abnormal uptake as well along small nodes in the posterior aspect of the neck, left greater than right and the left axillary region. Minimal uptake along the area of nodularity along the lateral aspect of the left breast. Please correlate with mammographic workup. Moderate colonic stool.  Colonic diverticula. Electronically Signed   By: Karen Kays M.D.   On: 07/01/2023 14:41    Labs:  CBC: Recent Labs    06/16/23 1549 07/08/23 1250  WBC 7.6 5.9  HGB 14.9 15.1*  HCT 43.9 43.8  PLT 288 265    COAGS: No results for input(s): "INR", "APTT" in the last 8760 hours.  BMP: Recent Labs    06/16/23 1550  NA 132*  K 3.1*  CL 95*  CO2 26  GLUCOSE 94  BUN 16  CALCIUM 9.8  CREATININE 0.72  GFRNONAA >60    LIVER FUNCTION TESTS: Recent Labs    06/16/23 1550  BILITOT 0.9  AST 41  ALT 38  ALKPHOS 75  PROT 7.4  ALBUMIN 4.4    TUMOR MARKERS: No results for input(s): "AFPTM", "CEA", "CA199", "CHROMGRNA" in the last 8760 hours.  Assessment and Plan:  Lymphoma with suspected skeletal metastases: Analysse Quinonez. Nicole York, 73 year old female, presents today to the [facility] Interventional Radiology department for an image-guided left iliac bone lesion biopsy.   Risks and benefits of this procedure were discussed with the patient and/or patient's family including, but not limited to bleeding, infection, damage to adjacent structures or low yield requiring additional tests.  All of the questions were answered and there is agreement to proceed. She has been  NPO. She is a full code.   Consent signed and in chart.  Thank you for this interesting consult.  I greatly enjoyed meeting BETHANNE MULE and look forward to participating in their care.  A copy of this report was  sent to the requesting provider on this date.  Electronically Signed: Alwyn Ren, AGACNP-BC 07/08/2023, 1:25 PM   I spent a total of  30 Minutes   in face to face in clinical consultation, greater than 50% of which was counseling/coordinating care for bone lesion biopsy.

## 2023-07-08 NOTE — Procedures (Signed)
Interventional Radiology Procedure Note  Procedure: CT guided biopsy of left iliac bone lesion  Complications: None  Estimated Blood Loss: None  Recommendations: - DC home    Signed,  Sterling Big, MD

## 2023-07-09 ENCOUNTER — Other Ambulatory Visit: Payer: PPO

## 2023-07-13 ENCOUNTER — Inpatient Hospital Stay: Payer: PPO | Admitting: Oncology

## 2023-07-17 LAB — SURGICAL PATHOLOGY

## 2023-07-20 ENCOUNTER — Telehealth: Payer: Self-pay | Admitting: Oncology

## 2023-07-20 ENCOUNTER — Inpatient Hospital Stay: Payer: PPO | Admitting: Oncology

## 2023-07-20 NOTE — Telephone Encounter (Signed)
 Called pt to let her know about the change in appt time. She verbalized understanding and was okay with the change

## 2023-07-21 ENCOUNTER — Inpatient Hospital Stay: Attending: Oncology | Admitting: Oncology

## 2023-07-21 ENCOUNTER — Encounter: Payer: Self-pay | Admitting: Oncology

## 2023-07-21 ENCOUNTER — Inpatient Hospital Stay: Payer: PPO | Admitting: Oncology

## 2023-07-21 VITALS — BP 158/82 | HR 64 | Temp 96.2°F | Resp 18 | Ht 62.0 in | Wt 129.4 lb

## 2023-07-21 DIAGNOSIS — Z79899 Other long term (current) drug therapy: Secondary | ICD-10-CM | POA: Diagnosis not present

## 2023-07-21 DIAGNOSIS — Z5112 Encounter for antineoplastic immunotherapy: Secondary | ICD-10-CM | POA: Insufficient documentation

## 2023-07-21 DIAGNOSIS — C8200 Follicular lymphoma grade I, unspecified site: Secondary | ICD-10-CM | POA: Diagnosis not present

## 2023-07-21 NOTE — Progress Notes (Signed)
START ON PATHWAY REGIMEN - Lymphoma and CLL     A cycle is every 7 days:     Rituximab-xxxx   **Always confirm dose/schedule in your pharmacy ordering system**  Patient Characteristics: Follicular Lymphoma, Grades 1, 2, and 3A, First Line, Stage III / IV, Asymptomatic or Low Bulk Disease Disease Type: Follicular Lymphoma, Grade 1, 2, or 3A Disease Type: Not Applicable Disease Type: Not Applicable Line of Therapy: First Line Disease Characteristics: Asymptomatic or Low Bulk Disease Intent of Therapy: Non-Curative / Palliative Intent, Discussed with Patient

## 2023-07-21 NOTE — Progress Notes (Signed)
 Patient denies new or acute problems/concerns today.

## 2023-07-21 NOTE — Progress Notes (Signed)
 Pharmacist Chemotherapy Monitoring - Initial Assessment    Anticipated start date: 07/24/23   The following has been reviewed per standard work regarding the patient's treatment regimen: The patient's diagnosis, treatment plan and drug doses, and organ/hematologic function Lab orders and baseline tests specific to treatment regimen  The treatment plan start date, drug sequencing, and pre-medications Prior authorization status  Patient's documented medication list, including drug-drug interaction screen and prescriptions for anti-emetics and supportive care specific to the treatment regimen The drug concentrations, fluid compatibility, administration routes, and timing of the medications to be used The patient's access for treatment and lifetime cumulative dose history, if applicable  The patient's medication allergies and previous infusion related reactions, if applicable   Changes made to treatment plan:  N/A  Follow up needed:  F/u tol of 1st dose Ruxience for consideration of rapid, if appropriate.   Ebony Hail, Pharm.D., CPP 07/21/2023@3 :05 PM

## 2023-07-22 ENCOUNTER — Other Ambulatory Visit: Payer: Self-pay

## 2023-07-22 ENCOUNTER — Encounter: Payer: Self-pay | Admitting: Oncology

## 2023-07-24 ENCOUNTER — Inpatient Hospital Stay

## 2023-07-24 ENCOUNTER — Encounter: Payer: Self-pay | Admitting: Oncology

## 2023-07-24 VITALS — BP 125/63 | HR 80 | Temp 98.1°F | Resp 18

## 2023-07-24 DIAGNOSIS — C8200 Follicular lymphoma grade I, unspecified site: Secondary | ICD-10-CM

## 2023-07-24 DIAGNOSIS — Z5112 Encounter for antineoplastic immunotherapy: Secondary | ICD-10-CM | POA: Diagnosis not present

## 2023-07-24 MED ORDER — DIPHENHYDRAMINE HCL 25 MG PO CAPS
50.0000 mg | ORAL_CAPSULE | Freq: Once | ORAL | Status: AC
  Administered 2023-07-24: 50 mg via ORAL
  Filled 2023-07-24: qty 2

## 2023-07-24 MED ORDER — SODIUM CHLORIDE 0.9 % IV SOLN
375.0000 mg/m2 | Freq: Once | INTRAVENOUS | Status: AC
  Administered 2023-07-24: 600 mg via INTRAVENOUS
  Filled 2023-07-24: qty 10

## 2023-07-24 MED ORDER — ACETAMINOPHEN 325 MG PO TABS
650.0000 mg | ORAL_TABLET | Freq: Once | ORAL | Status: AC
Start: 2023-07-24 — End: 2023-07-24
  Administered 2023-07-24: 650 mg via ORAL
  Filled 2023-07-24: qty 2

## 2023-07-24 MED ORDER — SODIUM CHLORIDE 0.9 % IV SOLN
INTRAVENOUS | Status: DC
  Filled 2023-07-24: qty 250

## 2023-07-24 NOTE — Progress Notes (Signed)
 Rapid Infusion Rituximab Pharmacist Evaluation  Nicole York is a 73 y.o. female being treated with rituximab for follicular lymphoma . This patient may be considered for RIR.   A pharmacist has verified the patient tolerated rituximab infusions per the [facility] standard infusion protocol without grade 3-4 infusion reactions. The treatment plan will be updated to reflect RIR if the patient qualifies per the checklist below:   Age > 48 years old Yes   Clinically significant cardiovascular disease No   Circulating lymphocyte count < 5000/uL prior to cycle two Yes  Lab Results  Component Value Date   LYMPHSABS 1.8 07/08/2023    Prior documented grade 3-4 infusion reaction to rituximab No   Prior documented grade 1-2 infusion reaction to rituximab (If YES, Pharmacist will confirm with Physician if patient is still a candidate for RIR) No   Previous rituximab infusion within the past 6 months No   Treatment Plan updated orders to reflect RIR Yes    [name] does meet the criteria for Rapid Infusion Rituximab. This patient is going to be switched to rapid infusion rituximab.   Sharen Hones, PharmD, BCPS Clinical Pharmacist   07/24/23 2:04 PM

## 2023-07-24 NOTE — Patient Instructions (Signed)
 [facility] - A DEPT OF [facility]  Discharge Instructions: Thank you for choosing [facility] to provide your oncology and hematology care.  If you have a lab appointment with the Cancer Center, please go directly to the Cancer Center and check in at the registration area.  Wear comfortable clothing and clothing appropriate for easy access to any Portacath or PICC line.   We strive to give you quality time with your provider. You may need to reschedule your appointment if you arrive late (15 or more minutes).  Arriving late affects you and other patients whose appointments are after yours.  Also, if you miss three or more appointments without notifying the office, you may be dismissed from the clinic at the provider's discretion.      For prescription refill requests, have your pharmacy contact our office and allow 72 hours for refills to be completed.    Today you received the following chemotherapy and/or immunotherapy agents Rituximab      To help prevent nausea and vomiting after your treatment, we encourage you to take your nausea medication as directed.  BELOW ARE SYMPTOMS THAT SHOULD BE REPORTED IMMEDIATELY: *FEVER GREATER THAN 100.4 F (38 C) OR HIGHER *CHILLS OR SWEATING *NAUSEA AND VOMITING THAT IS NOT CONTROLLED WITH YOUR NAUSEA MEDICATION *UNUSUAL SHORTNESS OF BREATH *UNUSUAL BRUISING OR BLEEDING *URINARY PROBLEMS (pain or burning when urinating, or frequent urination) *BOWEL PROBLEMS (unusual diarrhea, constipation, pain near the anus) TENDERNESS IN MOUTH AND THROAT WITH OR WITHOUT PRESENCE OF ULCERS (sore throat, sores in mouth, or a toothache) UNUSUAL RASH, SWELLING OR PAIN  UNUSUAL VAGINAL DISCHARGE OR ITCHING   Items with * indicate a potential emergency and should be followed up as soon as possible or go to the Emergency Department if any problems should occur.  Please show the CHEMOTHERAPY ALERT CARD or IMMUNOTHERAPY  ALERT CARD at check-in to the Emergency Department and triage nurse.  Should you have questions after your visit or need to cancel or reschedule your appointment, please contact [facility] - A DEPT OF [facility]  [phone] and follow the prompts.  Office hours are 8:00 a.m. to 4:30 p.m. Monday - Friday. Please note that voicemails left after 4:00 p.m. may not be returned until the following business day.  We are closed weekends and major holidays. You have access to a nurse at all times for urgent questions. Please call the main number to the clinic [phone] and follow the prompts.  For any non-urgent questions, you may also contact your provider using MyChart. We now offer e-Visits for anyone 25 and older to request care online for non-urgent symptoms. For details visit [email or web address].   Also download the MyChart app! Go to the app store, search "MyChart", open the app, select [facility], and log in with your MyChart username and password.     Rituximab Injection What is this medication? RITUXIMAB (ri TUX i mab) treats leukemia and lymphoma. It works by blocking a protein that causes cancer cells to grow and multiply. This helps to slow or stop the spread of cancer cells. It may also be used to treat autoimmune conditions, such as arthritis. It works by slowing down an overactive immune system. It is a monoclonal antibody. This medicine may be used for other purposes; ask your health care provider or pharmacist if you have questions. COMMON BRAND NAME(S): RIABNI, Rituxan, RUXIENCE, truxima What should I  tell my care team before I take this medication? They need to know if you have any of these conditions: Chest pain Heart disease Immune system problems Infection, such as chickenpox, cold sores, hepatitis B, herpes Irregular heartbeat or rhythm Kidney disease Low blood counts, such as low white cells, platelets, red cells Lung  disease Recent or upcoming vaccine An unusual or allergic reaction to rituximab, other medications, foods, dyes, or preservatives Pregnant or trying to get pregnant Breast-feeding How should I use this medication? This medication is injected into a vein. It is given by a care team in a hospital or clinic setting. A special MedGuide will be given to you before each treatment. Be sure to read this information carefully each time. Talk to your care team about the use of this medication in children. While this medication may be prescribed for children as young as 6 months for selected conditions, precautions do apply. Overdosage: If you think you have taken too much of this medicine contact a poison control center or emergency room at once. NOTE: This medicine is only for you. Do not share this medicine with others. What if I miss a dose? Keep appointments for follow-up doses. It is important not to miss your dose. Call your care team if you are unable to keep an appointment. What may interact with this medication? Do not take this medication with any of the following: Live vaccines This medication may also interact with the following: Cisplatin This list may not describe all possible interactions. Give your health care provider a list of all the medicines, herbs, non-prescription drugs, or dietary supplements you use. Also tell them if you smoke, drink alcohol, or use illegal drugs. Some items may interact with your medicine. What should I watch for while using this medication? Your condition will be monitored carefully while you are receiving this medication. You may need blood work while taking this medication. This medication can cause serious infusion reactions. To reduce the risk your care team may give you other medications to take before receiving this one. Be sure to follow the directions from your care team. This medication may increase your risk of getting an infection. Call your care  team for advice if you get a fever, chills, sore throat, or other symptoms of a cold or flu. Do not treat yourself. Try to avoid being around people who are sick. Call your care team if you are around anyone with measles, chickenpox, or if you develop sores or blisters that do not heal properly. Avoid taking medications that contain aspirin, acetaminophen, ibuprofen, naproxen, or ketoprofen unless instructed by your care team. These medications may hide a fever. This medication may cause serious skin reactions. They can happen weeks to months after starting the medication. Contact your care team right away if you notice fevers or flu-like symptoms with a rash. The rash may be red or purple and then turn into blisters or peeling of the skin. You may also notice a red rash with swelling of the face, lips, or lymph nodes in your neck or under your arms. In some patients, this medication may cause a serious brain infection that may cause death. If you have any problems seeing, thinking, speaking, walking, or standing, tell your care team right away. If you cannot reach your care team, urgently seek another source of medical care. Talk to your care team if you may be pregnant. Serious birth defects can occur if you take this medication during pregnancy and for 73  months after the last dose. You will need a negative pregnancy test before starting this medication. Contraception is recommended while taking this medication and for 12 months after the last dose. Your care team can help you find the option that works for you. Do not breastfeed while taking this medication and for at least 6 months after the last dose. What side effects may I notice from receiving this medication? Side effects that you should report to your care team as soon as possible: Allergic reactions or angioedema--skin rash, itching or hives, swelling of the face, eyes, lips, tongue, arms, or legs, trouble swallowing or breathing Bowel  blockage--stomach cramping, unable to have a bowel movement or pass gas, loss of appetite, vomiting Dizziness, loss of balance or coordination, confusion or trouble speaking Heart attack--pain or tightness in the chest, shoulders, arms, or jaw, nausea, shortness of breath, cold or clammy skin, feeling faint or lightheaded Heart rhythm changes--fast or irregular heartbeat, dizziness, feeling faint or lightheaded, chest pain, trouble breathing Infection--fever, chills, cough, sore throat, wounds that don't heal, pain or trouble when passing urine, general feeling of discomfort or being unwell Infusion reactions--chest pain, shortness of breath or trouble breathing, feeling faint or lightheaded Kidney injury--decrease in the amount of urine, swelling of the ankles, hands, or feet Liver injury--right upper belly pain, loss of appetite, nausea, light-colored stool, dark yellow or brown urine, yellowing skin or eyes, unusual weakness or fatigue Redness, blistering, peeling, or loosening of the skin, including inside the mouth Stomach pain that is severe, does not go away, or gets worse Tumor lysis syndrome (TLS)--nausea, vomiting, diarrhea, decrease in the amount of urine, dark urine, unusual weakness or fatigue, confusion, muscle pain or cramps, fast or irregular heartbeat, joint pain Side effects that usually do not require medical attention (report to your care team if they continue or are bothersome): Headache Joint pain Nausea Runny or stuffy nose Unusual weakness or fatigue This list may not describe all possible side effects. Call your doctor for medical advice about side effects. You may report side effects to FDA at 1-800-FDA-1088. Where should I keep my medication? This medication is given in a hospital or clinic. It will not be stored at home. NOTE: This sheet is a summary. It may not cover all possible information. If you have questions about this medicine, talk to your doctor, pharmacist,  or health care provider.  2024 [employer]/Gold Standard (2021-09-26 00:00:00)

## 2023-07-24 NOTE — Progress Notes (Signed)
 Hematology/Oncology Consult note [facility]  Telephone:([phone] [phone]  Patient Care Team: Marisue Ivan, MD as PCP - General (Family Medicine) Marisue Ivan, MD as Referring Physician (Family Medicine) Earline Mayotte, MD (General Surgery)   Name of the patient: Nicole York  [id number]  10-Jan-1951   Date of visit: 07/24/23  Diagnosis-follicular lymphoma stage IV  Chief complaint/ Reason for visit-discuss bone biopsy results and further management  Heme/Onc history: Patient is a 73 year old female with a past medical history signal taken for hyperlipidemia osteopenia IBS who underwent CT chest as a part of lung cancer screening program which incidentally showed a 9 mm nodule in the lateral aspect of the left breast which was more conspicuous as compared to what was noted on CT in October 2023 when it was 6 mm.  This was followed by a bilateral diagnostic mammogram which showed that this 9 mm mass appears to be an intramammary lymph node in the left upper outer breast.  There were no suspicious breast masses noted in the left breast.  No findings suggestive of malignancy in the right breast.  Patient underwent core biopsy of this intramammary lymph node which showed atypical nodular B-cell infiltrate favoring a lymphoproliferative disorder.   THE SPECIMEN CONSISTS OF FRAGMENTED CORES OF LYMPHOID TISSUE WITH A      NODULAR ARCHITECTURE CONSISTING OF AN MOSTLY  UNPOLARIZED FOLLICLES BUT WITH      RARE TINGIBLE BODY MACROPHAGE.  CD20 HIGHLIGHTS THE FOLLICLES AS WELL AS      PARAFOLLICULAR SMALL B CELL WHICH ARE ALSO PREDOMINANTLY CD10 AND BCL6 POSITIVE.      BCL-2 IS ALSO POSITIVE IN THE MAJORITY OF THE CELLS, BUT IS FOCALLY WEAK/ABSENT      IN AREAS OF THE FOLLICLES.  CD23 HIGHLIGHTS AND EXPANDED FOLLICULAR DENDRITIC      CELL MESHWORK AS WELL AS POSITIVITY ON THE B CELLS.  THE PROLIFERATION RATE BY      KI-67 IS LOW (LESS THAN 5%)  AND IS ABNORMALLY LOW IN THE AREA OF FOLLICLES.      CD3/CD5/CD43 HIGHLIGHTS SMALL BACKGROUND T CELLS.  CYCLIN D1 IS NEGATIVE.  FLOW      CYTOMETRY SHOWS A DUAL POPULATION OF B CELLS WITH A SUBSET SHOWING POLYCLONAL B      CELLS AND ANOTHER SUBSET THAT IS CD10 POSITIVE AND OVERALL APPEARS DIM FOR      LAMBDA BUT NOT DEFINITIVELY PATHOGNOMONIC FOR LAMBDA RESTRICTION.  THE OVERALL      FINDINGS ARE ATYPICAL AND FAVOR A LYMPHOPROLIFERATIVE DISORDER WITH A      DIFFERENTIAL DIAGNOSIS INCLUDING BOTH A FOLLICULAR LYMPHOMA (FAVORED) AS WELL AS      A MARGINAL ZONE LYMPHOMA WITH FOLLICULAR COLONIZATION.  TO FURTHER      ASSESS/CHARACTERIZE THE LESION MOLECULAR TESTING FOR AN IGH GENE REARRANGEMENT      AS WELL AS LOW-GRADE B-CELL LYMPHOMA.IgH/Bcl-2 consistent with morphologic impression of follicular lymphoma  PET CT scan showed multifocal areas of abnormal uptake in the skeleton including the ribs sternum multiple vertebral bodies and right humeral head.  Patient underwent bone biopsy involving the area of iliac bone surrounding the sacrum pathology showed atypical lymphoid infiltrate with lambda predominance suspicious for lymphoproliferative disorder but definitive diagnosis could not be made due to predominant crush artifact. The B cells do not appear to express CD5, CD10, CD21 or cyclin D1; however, there is some nonspecific staining present. Bcl-2 and Bcl-6 are positive but difficult to assess a distinct population of  B-cells. Kappa and lambda by in situ hybridization highlight polytypic plasma cells.  EBV by in situ hybridization is negative.  Flow cytometry performed on the sample ([id number]) identifies a B-cell population (CD19, CD20, CD22) with lambda excess comprising 13% of lymphocytes. Overall, the features are worrisome for a lymphoproliferative process   Interval history-patient is doing well presently and denies any specific complaints at this time  ECOG PS- 0 Pain scale- 0  Review of  systems- Review of Systems  Constitutional:  Negative for chills, fever, malaise/fatigue and weight loss.  HENT:  Negative for congestion, ear discharge and nosebleeds.   Eyes:  Negative for blurred vision.  Respiratory:  Negative for cough, hemoptysis, sputum production, shortness of breath and wheezing.   Cardiovascular:  Negative for chest pain, palpitations, orthopnea and claudication.  Gastrointestinal:  Negative for abdominal pain, blood in stool, constipation, diarrhea, heartburn, melena, nausea and vomiting.  Genitourinary:  Negative for dysuria, flank pain, frequency, hematuria and urgency.  Musculoskeletal:  Negative for back pain, joint pain and myalgias.  Skin:  Negative for rash.  Neurological:  Negative for dizziness, tingling, focal weakness, seizures, weakness and headaches.  Endo/Heme/Allergies:  Does not bruise/bleed easily.  Psychiatric/Behavioral:  Negative for depression and suicidal ideas. The patient does not have insomnia.       No Known Allergies   Past Medical History:  Diagnosis Date   Allergy    Depression    Eczematous dermatitis of eyelid    History of chicken pox    Hyperlipidemia    IBS (irritable bowel syndrome)    Osteopenia      Past Surgical History:  Procedure Laterality Date   BREAST BIOPSY Left 06/18/2015   BENIGN BREAST TISSUE WITH STROMAL FIBROSIS AND FIBROCYSTIC CHANGE.    BREAST BIOPSY Right 11/30/2018   affirm bx   ribbon marker   neg   BREAST BIOPSY Left 06/05/2023   [place] bx 2:00 9 cmfn, heart marker, path pending   BREAST BIOPSY Left 06/05/2023   [place] LT BREAST BX W LOC DEV 1ST LESION IMG BX SPEC US GUIDE 06/05/2023 [facility]   BREAST EXCISIONAL BIOPSY Right 1989   COLONOSCOPY WITH PROPOFOL N/A 09/18/2015   Procedure: COLONOSCOPY WITH PROPOFOL;  Surgeon: Christena Deem, MD;  Location: [facility] ENDOSCOPY;  Service: Endoscopy;  Laterality: N/A;   COLONOSCOPY WITH PROPOFOL N/A 10/02/2021   Procedure: COLONOSCOPY WITH PROPOFOL;   Surgeon: Toledo, Boykin Nearing, MD;  Location: [facility] ENDOSCOPY;  Service: Gastroenterology;  Laterality: N/A;   ESOPHAGOGASTRODUODENOSCOPY (EGD) WITH PROPOFOL N/A 10/02/2021   Procedure: ESOPHAGOGASTRODUODENOSCOPY (EGD) WITH PROPOFOL;  Surgeon: Toledo, Boykin Nearing, MD;  Location: [facility] ENDOSCOPY;  Service: Gastroenterology;  Laterality: N/A;   TONSILLECTOMY     TUBAL LIGATION      Social History   Socioeconomic History   Marital status: Married    Spouse name: Not on file   Number of children: Not on file   Years of education: Not on file   Highest education level: Not on file  Occupational History   Not on file  Tobacco Use   Smoking status: Former    Current packs/day: 0.00    Average packs/day: 1 pack/day for 43.0 years (43.0 ttl pk-yrs)    Types: Cigarettes    Start date: 05/05/1971    Quit date: 05/04/2014    Years since quitting: 9.2   Smokeless tobacco: Never  Vaping Use   Vaping status: Never Used  Substance and Sexual Activity   Alcohol use: Not Currently  Comment: socially   Drug use: No   Sexual activity: Not on file  Other Topics Concern   Not on file  Social History Narrative   Lives with [place] Distance, husband. No indoor pets.   Social Drivers of Corporate investment banker Strain: Low Risk  (03/04/2023)   Received from [facility]   Overall Financial Resource Strain (CARDIA)    Difficulty of Paying Living Expenses: Not hard at all  Food Insecurity: No Food Insecurity (06/16/2023)   Hunger Vital Sign    Worried About Running Out of Food in the Last Year: Never true    Ran Out of Food in the Last Year: Never true  Transportation Needs: No Transportation Needs (06/16/2023)   PRAPARE - Administrator, Civil Service (Medical): No    Lack of Transportation (Non-Medical): No  Physical Activity: Not on file  Stress: Not on file  Social Connections: Not on file  Intimate Partner Violence: Not At Risk (06/16/2023)   Humiliation, Afraid, Rape,  and Kick questionnaire    Fear of Current or Ex-Partner: No    Emotionally Abused: No    Physically Abused: No    Sexually Abused: No    Family History  Problem Relation Age of Onset   Breast cancer Mother    Hyperlipidemia Mother    Cancer Mother 8       breast   Mental illness Mother        Alzheimers Dementia   Hyperlipidemia Father    Hypertension Father      Current Outpatient Medications:    Ascorbic Acid (VITAMIN C) 1000 MG tablet, Take 1,000 mg by mouth daily., Disp: , Rfl:    aspirin 81 MG EC tablet, Take 325 mg by mouth daily.  , Disp: , Rfl:    atorvastatin (LIPITOR) 40 MG tablet, Take 1 tablet by mouth at bedtime., Disp: , Rfl:    Calcium Citrate-Vitamin D (CITRACAL + D PO), Take by mouth daily at 8 pm., Disp: , Rfl:    cetirizine (ZYRTEC) 10 MG tablet, Take 10 mg by mouth daily.  , Disp: , Rfl:    chlorthalidone (HYGROTON) 25 MG tablet, Take 1 tablet by mouth daily., Disp: , Rfl:    cholecalciferol (VITAMIN D) 1000 UNITS tablet, Take 1,000 Units by mouth daily.  , Disp: , Rfl:    cyanocobalamin (VITAMIN B12) 1000 MCG tablet, Take 1,000 mcg by mouth daily., Disp: , Rfl:    escitalopram (LEXAPRO) 5 MG tablet, Take 1 tablet (5 mg total) by mouth at bedtime., Disp: 30 tablet, Rfl: 0   ferrous sulfate 324 (65 Fe) MG TBEC, Take by mouth., Disp: , Rfl:    fish oil-omega-3 fatty acids 1000 MG capsule, Take 2 g by mouth daily.  , Disp: , Rfl:    fluticasone (FLONASE) 50 MCG/ACT nasal spray, USE 2 SPRAY IN EACH NOSTRIL EVERY DAY, Disp: 16 g, Rfl: 0   MULTIPLE VITAMIN PO, Take by mouth.  , Disp: , Rfl:    Multiple Vitamins-Minerals (MULTIVITAMIN ADULTS) TABS, Take by mouth., Disp: , Rfl:    traZODone (DESYREL) 50 MG tablet, Take by mouth., Disp: , Rfl:    vitamin E 400 UNIT capsule, Take 400 Units by mouth daily.  , Disp: , Rfl:   Physical exam:  Vitals:   07/21/23 1300  BP: (!) 158/82  Pulse: 64  Resp: 18  Temp: (!) 96.2 F (35.7 C)  TempSrc: Tympanic  Weight: 129  lb 6.4 oz (  58.7 kg)  Height: 5\' 2"  (1.575 m)   Physical Exam Cardiovascular:     Rate and Rhythm: Normal rate and regular rhythm.     Heart sounds: Normal heart sounds.  Pulmonary:     Effort: Pulmonary effort is normal.  Skin:    General: Skin is warm and dry.  Neurological:     Mental Status: She is alert and oriented to person, place, and time.         Latest Ref Rng & Units 06/16/2023    3:50 PM  CMP  Glucose 70 - 99 mg/dL 94   BUN 8 - 23 mg/dL 16   Creatinine 9.60 - 1.00 mg/dL 4.54   Sodium 098 - 119 mmol/L 132   Potassium 3.5 - 5.1 mmol/L 3.1   Chloride 98 - 111 mmol/L 95   CO2 22 - 32 mmol/L 26   Calcium 8.9 - 10.3 mg/dL 9.8   Total Protein 6.5 - 8.1 g/dL 7.4   Total Bilirubin 0.0 - 1.2 mg/dL 0.9   Alkaline Phos 38 - 126 U/L 75   AST 15 - 41 U/L 41   ALT 0 - 44 U/L 38       Latest Ref Rng & Units 07/08/2023   12:50 PM  CBC  WBC 4.0 - 10.5 K/uL 5.9   Hemoglobin 12.0 - 15.0 g/dL 14.7   Hematocrit 82.9 - 46.0 % 43.8   Platelets 150 - 400 K/uL 265       CT BONE TROCAR/NEEDLE BIOPSY DEEP Result Date: 07/08/2023 INDICATION: Recent diagnosis of B-cell lymphoproliferative process. PET-CT demonstrates ill-defined hypermetabolic activity within the right iliac bone. Patient presents for CT-guided core biopsy to assess bone involvement. EXAM: CT-guided deep bone biopsy MEDICATIONS: None. ANESTHESIA/SEDATION: Moderate (conscious) sedation was employed during this procedure. A total of Versed 1.5 mg and Fentanyl 75 mcg was administered intravenously by the radiology nurse. Total intra-service moderate Sedation Time: 10 minutes. The patient's level of consciousness and vital signs were monitored continuously by radiology nursing throughout the procedure under my direct supervision. COMPLICATIONS: None immediate. PROCEDURE: Informed written consent was obtained from the patient after a thorough discussion of the procedural risks, benefits and alternatives. All questions were  addressed. Maximal Sterile Barrier Technique was utilized including caps, mask, sterile gowns, sterile gloves, sterile drape, hand hygiene and skin antiseptic. A timeout was performed prior to the initiation of the procedure. A planning axial CT scan was performed. The right iliac bone was localized. Confirmation was made with correlation from the prior PET-CT. Hypermetabolic region was selected and targeted. An appropriate skin entry site was marked. Local anesthesia was attained by infiltration with 1% lidocaine. A small dermatotomy was made. Under intermittent CT guidance, an 11 gauge trocar needle was advanced through the bone cortex utilizing the on control drill. Multiple 9 gauge core biopsies were then obtained of the region of interest again using the OnControl drill. Biopsy specimens were placed on a moistened Telfa pad and delivered to pathology for further analysis. IMPRESSION: Technically successful CT-guided biopsy of FDG avid region of the right iliac bone. Electronically Signed   By: Malachy Moan M.D.   On: 07/08/2023 15:17     Assessment and plan- Patient is a 73 y.o. female with history of follicular lymphoma here to discuss bone biopsy results and further management  Patient was found to have an incidentally enlarged intramammary lymph node as a part of CT chest lung cancer screening program.  This lymph node was biopsied and was consistent  with a low-grade follicular lymphoma and FISH studies also confirmed the same.  PET CT showed few small lymph nodes in the left posterior neck which were mildly hypermetabolic along with multiple areas of abnormal uptake in the skeleton including multiple ribs sternum vertebral bodies and right humeral head.  She underwent bone biopsy of the posterior iliac bone on the left side which was appearing suspicious on the PET scan.  Bone biopsy shows findings highly concerning for lymphoproliferative disorder given the atypical lymphoid infiltrate with  lambda predominance however a definitive diagnosis was not made due to crush artifact.  Given the overall clinical picture and biopsy findings from the intramammary lymph node this is consistent with follicular lymphoma.  Patient does not have any B symptoms cytopenias or GELF criteria to necessitate treatment for her follicular lymphoma.  However given multiple areas of bone involvement and potential for fracture in the future I would recommend proceeding with single agent Rituxan 375 mg meter square weekly x 4 over watchful observation at this time.  Discussed risks and benefits of Rituxan including all but not limited to nausea vomiting low blood counts risk of infections and infusion reactions.  Treatment will be given with a palliative intent.  Follicular lymphomas are not considered curable but treatment can put them in remission.  However follicular lymphomas can recur in the future.  I do not feel that doing combination treatment with Bendamustine and Rituxan is warranted at this time given overall low burden of disease and patient being asymptomatic.  If patient does not respond to 4 weekly cycles of Rituxan after getting a repeat PET scan I will either consider watchful observation or second opinion at [facility] at that time.  Patient comprehends my plan well   Visit Diagnosis 1. Follicular lymphoma grade I, unspecified body region [facility])      Dr. Owens Shark, MD, MPH [facility] [phone] 07/24/2023 7:40 AM

## 2023-07-31 ENCOUNTER — Ambulatory Visit

## 2023-07-31 ENCOUNTER — Inpatient Hospital Stay

## 2023-07-31 VITALS — BP 142/72 | HR 57 | Temp 97.8°F | Resp 18 | Wt 128.4 lb

## 2023-07-31 DIAGNOSIS — Z5112 Encounter for antineoplastic immunotherapy: Secondary | ICD-10-CM | POA: Diagnosis not present

## 2023-07-31 DIAGNOSIS — C8200 Follicular lymphoma grade I, unspecified site: Secondary | ICD-10-CM

## 2023-07-31 LAB — CBC WITH DIFFERENTIAL (CANCER CENTER ONLY)
Abs Immature Granulocytes: 0.02 10*3/uL (ref 0.00–0.07)
Basophils Absolute: 0 10*3/uL (ref 0.0–0.1)
Basophils Relative: 0 %
Eosinophils Absolute: 0.1 10*3/uL (ref 0.0–0.5)
Eosinophils Relative: 1 %
HCT: 42.3 % (ref 36.0–46.0)
Hemoglobin: 14.5 g/dL (ref 12.0–15.0)
Immature Granulocytes: 0 %
Lymphocytes Relative: 24 %
Lymphs Abs: 1.3 10*3/uL (ref 0.7–4.0)
MCH: 30.2 pg (ref 26.0–34.0)
MCHC: 34.3 g/dL (ref 30.0–36.0)
MCV: 88.1 fL (ref 80.0–100.0)
Monocytes Absolute: 0.5 10*3/uL (ref 0.1–1.0)
Monocytes Relative: 9 %
Neutro Abs: 3.6 10*3/uL (ref 1.7–7.7)
Neutrophils Relative %: 66 %
Platelet Count: 241 10*3/uL (ref 150–400)
RBC: 4.8 MIL/uL (ref 3.87–5.11)
RDW: 13.1 % (ref 11.5–15.5)
WBC Count: 5.6 10*3/uL (ref 4.0–10.5)
nRBC: 0 % (ref 0.0–0.2)

## 2023-07-31 LAB — CMP (CANCER CENTER ONLY)
ALT: 27 U/L (ref 0–44)
AST: 29 U/L (ref 15–41)
Albumin: 3.9 g/dL (ref 3.5–5.0)
Alkaline Phosphatase: 65 U/L (ref 38–126)
Anion gap: 10 (ref 5–15)
BUN: 12 mg/dL (ref 8–23)
CO2: 25 mmol/L (ref 22–32)
Calcium: 8.9 mg/dL (ref 8.9–10.3)
Chloride: 97 mmol/L — ABNORMAL LOW (ref 98–111)
Creatinine: 0.83 mg/dL (ref 0.44–1.00)
GFR, Estimated: >60 mL/min (ref >60)
Glucose, Bld: 94 mg/dL (ref 70–99)
Potassium: 3.4 mmol/L — ABNORMAL LOW (ref 3.5–5.1)
Sodium: 132 mmol/L — ABNORMAL LOW (ref 135–145)
Total Bilirubin: 0.9 mg/dL (ref 0.0–1.2)
Total Protein: 6.7 g/dL (ref 6.5–8.1)

## 2023-07-31 MED ORDER — ACETAMINOPHEN 325 MG PO TABS
650.0000 mg | ORAL_TABLET | Freq: Once | ORAL | Status: AC
  Administered 2023-07-31: 650 mg via ORAL
  Filled 2023-07-31: qty 2

## 2023-07-31 MED ORDER — DIPHENHYDRAMINE HCL 25 MG PO CAPS
50.0000 mg | ORAL_CAPSULE | Freq: Once | ORAL | Status: AC
Start: 2023-07-31 — End: 2023-07-31
  Administered 2023-07-31: 50 mg via ORAL
  Filled 2023-07-31: qty 2

## 2023-07-31 MED ORDER — SODIUM CHLORIDE 0.9 % IV SOLN
INTRAVENOUS | Status: DC
  Filled 2023-07-31: qty 250

## 2023-07-31 MED ORDER — SODIUM CHLORIDE 0.9 % IV SOLN
375.0000 mg/m2 | Freq: Once | INTRAVENOUS | Status: AC
  Administered 2023-07-31: 600 mg via INTRAVENOUS
  Filled 2023-07-31: qty 60
  Filled 2023-07-31: qty 10

## 2023-07-31 NOTE — Patient Instructions (Signed)
 [facility] - A DEPT OF [facility]  Discharge Instructions: Thank you for choosing [facility] to provide your oncology and hematology care.  If you have a lab appointment with the Cancer Center, please go directly to the Cancer Center and check in at the registration area.  Wear comfortable clothing and clothing appropriate for easy access to any Portacath or PICC line.   We strive to give you quality time with your provider. You may need to reschedule your appointment if you arrive late (15 or more minutes).  Arriving late affects you and other patients whose appointments are after yours.  Also, if you miss three or more appointments without notifying the office, you may be dismissed from the clinic at the provider's discretion.      For prescription refill requests, have your pharmacy contact our office and allow 72 hours for refills to be completed.    Today you received the following chemotherapy and/or immunotherapy agents Ruxience      To help prevent nausea and vomiting after your treatment, we encourage you to take your nausea medication as directed.  BELOW ARE SYMPTOMS THAT SHOULD BE REPORTED IMMEDIATELY: *FEVER GREATER THAN 100.4 F (38 C) OR HIGHER *CHILLS OR SWEATING *NAUSEA AND VOMITING THAT IS NOT CONTROLLED WITH YOUR NAUSEA MEDICATION *UNUSUAL SHORTNESS OF BREATH *UNUSUAL BRUISING OR BLEEDING *URINARY PROBLEMS (pain or burning when urinating, or frequent urination) *BOWEL PROBLEMS (unusual diarrhea, constipation, pain near the anus) TENDERNESS IN MOUTH AND THROAT WITH OR WITHOUT PRESENCE OF ULCERS (sore throat, sores in mouth, or a toothache) UNUSUAL RASH, SWELLING OR PAIN  UNUSUAL VAGINAL DISCHARGE OR ITCHING   Items with * indicate a potential emergency and should be followed up as soon as possible or go to the Emergency Department if any problems should occur.  Please show the CHEMOTHERAPY ALERT CARD or IMMUNOTHERAPY  ALERT CARD at check-in to the Emergency Department and triage nurse.  Should you have questions after your visit or need to cancel or reschedule your appointment, please contact [facility] - A DEPT OF [facility]  [phone] and follow the prompts.  Office hours are 8:00 a.m. to 4:30 p.m. Monday - Friday. Please note that voicemails left after 4:00 p.m. may not be returned until the following business day.  We are closed weekends and major holidays. You have access to a nurse at all times for urgent questions. Please call the main number to the clinic [phone] and follow the prompts.  For any non-urgent questions, you may also contact your provider using MyChart. We now offer e-Visits for anyone 55 and older to request care online for non-urgent symptoms. For details visit [email or web address].   Also download the MyChart app! Go to the app store, search "MyChart", open the app, select [place], and log in with your MyChart username and password.

## 2023-08-03 ENCOUNTER — Other Ambulatory Visit: Payer: Self-pay | Admitting: Oncology

## 2023-08-03 DIAGNOSIS — C8200 Follicular lymphoma grade I, unspecified site: Secondary | ICD-10-CM

## 2023-08-06 ENCOUNTER — Other Ambulatory Visit: Payer: Self-pay

## 2023-08-06 DIAGNOSIS — C8200 Follicular lymphoma grade I, unspecified site: Secondary | ICD-10-CM

## 2023-08-06 DIAGNOSIS — B351 Tinea unguium: Secondary | ICD-10-CM | POA: Diagnosis not present

## 2023-08-06 DIAGNOSIS — L821 Other seborrheic keratosis: Secondary | ICD-10-CM | POA: Diagnosis not present

## 2023-08-06 DIAGNOSIS — Z08 Encounter for follow-up examination after completed treatment for malignant neoplasm: Secondary | ICD-10-CM | POA: Diagnosis not present

## 2023-08-06 DIAGNOSIS — L814 Other melanin hyperpigmentation: Secondary | ICD-10-CM | POA: Diagnosis not present

## 2023-08-06 DIAGNOSIS — Z85828 Personal history of other malignant neoplasm of skin: Secondary | ICD-10-CM | POA: Diagnosis not present

## 2023-08-07 ENCOUNTER — Inpatient Hospital Stay: Admitting: Oncology

## 2023-08-07 ENCOUNTER — Inpatient Hospital Stay

## 2023-08-07 ENCOUNTER — Ambulatory Visit

## 2023-08-07 ENCOUNTER — Encounter: Payer: Self-pay | Admitting: Oncology

## 2023-08-07 VITALS — BP 135/69 | HR 64 | Temp 97.2°F | Resp 19 | Ht 61.0 in | Wt 125.0 lb

## 2023-08-07 VITALS — BP 147/65

## 2023-08-07 DIAGNOSIS — C8209 Follicular lymphoma grade I, extranodal and solid organ sites: Secondary | ICD-10-CM

## 2023-08-07 DIAGNOSIS — C8200 Follicular lymphoma grade I, unspecified site: Secondary | ICD-10-CM

## 2023-08-07 DIAGNOSIS — Z5181 Encounter for therapeutic drug level monitoring: Secondary | ICD-10-CM

## 2023-08-07 DIAGNOSIS — Z79620 Long term (current) use of immunosuppressive biologic: Secondary | ICD-10-CM | POA: Diagnosis not present

## 2023-08-07 DIAGNOSIS — Z5112 Encounter for antineoplastic immunotherapy: Secondary | ICD-10-CM | POA: Diagnosis not present

## 2023-08-07 LAB — CMP (CANCER CENTER ONLY)
ALT: 28 U/L (ref 0–44)
AST: 31 U/L (ref 15–41)
Albumin: 4.1 g/dL (ref 3.5–5.0)
Alkaline Phosphatase: 69 U/L (ref 38–126)
Anion gap: 9 (ref 5–15)
BUN: 13 mg/dL (ref 8–23)
CO2: 27 mmol/L (ref 22–32)
Calcium: 9.1 mg/dL (ref 8.9–10.3)
Chloride: 99 mmol/L (ref 98–111)
Creatinine: 0.85 mg/dL (ref 0.44–1.00)
GFR, Estimated: >60 mL/min (ref >60)
Glucose, Bld: 93 mg/dL (ref 70–99)
Potassium: 3.3 mmol/L — ABNORMAL LOW (ref 3.5–5.1)
Sodium: 135 mmol/L (ref 135–145)
Total Bilirubin: 0.7 mg/dL (ref 0.0–1.2)
Total Protein: 6.8 g/dL (ref 6.5–8.1)

## 2023-08-07 LAB — CBC WITH DIFFERENTIAL (CANCER CENTER ONLY)
Abs Immature Granulocytes: 0.02 10*3/uL (ref 0.00–0.07)
Basophils Absolute: 0.1 10*3/uL (ref 0.0–0.1)
Basophils Relative: 1 %
Eosinophils Absolute: 0.1 10*3/uL (ref 0.0–0.5)
Eosinophils Relative: 1 %
HCT: 43 % (ref 36.0–46.0)
Hemoglobin: 14.4 g/dL (ref 12.0–15.0)
Immature Granulocytes: 0 %
Lymphocytes Relative: 24 %
Lymphs Abs: 1.5 10*3/uL (ref 0.7–4.0)
MCH: 30 pg (ref 26.0–34.0)
MCHC: 33.5 g/dL (ref 30.0–36.0)
MCV: 89.6 fL (ref 80.0–100.0)
Monocytes Absolute: 0.6 10*3/uL (ref 0.1–1.0)
Monocytes Relative: 10 %
Neutro Abs: 4 10*3/uL (ref 1.7–7.7)
Neutrophils Relative %: 64 %
Platelet Count: 256 10*3/uL (ref 150–400)
RBC: 4.8 MIL/uL (ref 3.87–5.11)
RDW: 13 % (ref 11.5–15.5)
WBC Count: 6.2 10*3/uL (ref 4.0–10.5)
nRBC: 0 % (ref 0.0–0.2)

## 2023-08-07 MED ORDER — SODIUM CHLORIDE 0.9 % IV SOLN
INTRAVENOUS | Status: DC
  Filled 2023-08-07: qty 250

## 2023-08-07 MED ORDER — SODIUM CHLORIDE 0.9 % IV SOLN
375.0000 mg/m2 | Freq: Once | INTRAVENOUS | Status: AC
  Administered 2023-08-07: 600 mg via INTRAVENOUS
  Filled 2023-08-07: qty 10

## 2023-08-07 MED ORDER — ACETAMINOPHEN 325 MG PO TABS
650.0000 mg | ORAL_TABLET | Freq: Once | ORAL | Status: AC
  Administered 2023-08-07: 650 mg via ORAL
  Filled 2023-08-07: qty 2

## 2023-08-07 MED ORDER — DIPHENHYDRAMINE HCL 25 MG PO CAPS
50.0000 mg | ORAL_CAPSULE | Freq: Once | ORAL | Status: AC
  Administered 2023-08-07: 50 mg via ORAL
  Filled 2023-08-07: qty 2

## 2023-08-07 NOTE — Progress Notes (Signed)
 Hematology/Oncology Consult note [facility]  [phone] [phone]  Patient Care Team: Marisue Ivan, MD as PCP - General (Family Medicine) Marisue Ivan, MD as Referring Physician (Family Medicine) Earline Mayotte, MD (General Surgery)   Name of the patient: Nicole York  [id number]  06-10-1950   Date of visit: 08/07/23  Diagnosis-low-grade follicular lymphoma stage IV  Chief complaint/ Reason for visit-on treatment assessment prior to cycle 3 of weekly Rituxan chemotherapy  Heme/Onc history:  Patient is a 73 year old female with a past medical history signal taken for hyperlipidemia osteopenia IBS who underwent CT chest as a part of lung cancer screening program which incidentally showed a 9 mm nodule in the lateral aspect of the left breast which was more conspicuous as compared to what was noted on CT in October 2023 when it was 6 mm.  This was followed by a bilateral diagnostic mammogram which showed that this 9 mm mass appears to be an intramammary lymph node in the left upper outer breast.  There were no suspicious breast masses noted in the left breast.  No findings suggestive of malignancy in the right breast.  Patient underwent core biopsy of this intramammary lymph node which showed atypical nodular B-cell infiltrate favoring a lymphoproliferative disorder.   THE SPECIMEN CONSISTS OF FRAGMENTED CORES OF LYMPHOID TISSUE WITH A      NODULAR ARCHITECTURE CONSISTING OF AN MOSTLY  UNPOLARIZED FOLLICLES BUT WITH      RARE TINGIBLE BODY MACROPHAGE.  CD20 HIGHLIGHTS THE FOLLICLES AS WELL AS      PARAFOLLICULAR SMALL B CELL WHICH ARE ALSO PREDOMINANTLY CD10 AND BCL6 POSITIVE.      BCL-2 IS ALSO POSITIVE IN THE MAJORITY OF THE CELLS, BUT IS FOCALLY WEAK/ABSENT      IN AREAS OF THE FOLLICLES.  CD23 HIGHLIGHTS AND EXPANDED FOLLICULAR DENDRITIC      CELL MESHWORK AS WELL AS POSITIVITY ON THE B CELLS.  THE PROLIFERATION RATE BY       KI-67 IS LOW (LESS THAN 5%) AND IS ABNORMALLY LOW IN THE AREA OF FOLLICLES.      CD3/CD5/CD43 HIGHLIGHTS SMALL BACKGROUND T CELLS.  CYCLIN D1 IS NEGATIVE.  FLOW      CYTOMETRY SHOWS A DUAL POPULATION OF B CELLS WITH A SUBSET SHOWING POLYCLONAL B      CELLS AND ANOTHER SUBSET THAT IS CD10 POSITIVE AND OVERALL APPEARS DIM FOR      LAMBDA BUT NOT DEFINITIVELY PATHOGNOMONIC FOR LAMBDA RESTRICTION.  THE OVERALL      FINDINGS ARE ATYPICAL AND FAVOR A LYMPHOPROLIFERATIVE DISORDER WITH A      DIFFERENTIAL DIAGNOSIS INCLUDING BOTH A FOLLICULAR LYMPHOMA (FAVORED) AS WELL AS      A MARGINAL ZONE LYMPHOMA WITH FOLLICULAR COLONIZATION.  TO FURTHER      ASSESS/CHARACTERIZE THE LESION MOLECULAR TESTING FOR AN IGH GENE REARRANGEMENT      AS WELL AS LOW-GRADE B-CELL LYMPHOMA.IgH/Bcl-2 consistent with morphologic impression of follicular lymphoma   PET CT scan showed multifocal areas of abnormal uptake in the skeleton including the ribs sternum multiple vertebral bodies and right humeral head.  Patient underwent bone biopsy involving the area of iliac bone surrounding the sacrum pathology showed atypical lymphoid infiltrate with lambda predominance suspicious for lymphoproliferative disorder but definitive diagnosis could not be made due to predominant crush artifact. The B cells do not appear to express CD5, CD10, CD21 or cyclin D1; however, there is some nonspecific staining present. Bcl-2 and Bcl-6 are positive but  difficult to assess a distinct population of B-cells. Kappa and lambda by in situ hybridization highlight polytypic plasma cells.  EBV by in situ hybridization is negative.  Flow cytometry performed on the sample ([id number]) identifies a B-cell population (CD19, CD20, CD22) with lambda excess comprising 13% of lymphocytes. Overall, the features are worrisome for a lymphoproliferative process   Interval history-tolerating Rituxan well without any significant side effects.  She has noticed intermittent  episodes of headaches since she started Rituxan which last for a minute or 2 and resolve on its own.  She has a toenail fungal infection for which she was started on terbinafine and she will take that for a week every month for 6 months.  ECOG PS- 0 Pain scale- 0   Review of systems- Review of Systems  Constitutional:  Negative for chills, fever, malaise/fatigue and weight loss.  HENT:  Negative for congestion, ear discharge and nosebleeds.   Eyes:  Negative for blurred vision.  Respiratory:  Negative for cough, hemoptysis, sputum production, shortness of breath and wheezing.   Cardiovascular:  Negative for chest pain, palpitations, orthopnea and claudication.  Gastrointestinal:  Negative for abdominal pain, blood in stool, constipation, diarrhea, heartburn, melena, nausea and vomiting.  Genitourinary:  Negative for dysuria, flank pain, frequency, hematuria and urgency.  Musculoskeletal:  Negative for back pain, joint pain and myalgias.  Skin:  Negative for rash.  Neurological:  Negative for dizziness, tingling, focal weakness, seizures, weakness and headaches.  Endo/Heme/Allergies:  Does not bruise/bleed easily.  Psychiatric/Behavioral:  Negative for depression and suicidal ideas. The patient does not have insomnia.       No Known Allergies   Past Medical History:  Diagnosis Date   Allergy    Depression    Eczematous dermatitis of eyelid    History of chicken pox    Hyperlipidemia    IBS (irritable bowel syndrome)    Osteopenia      Past Surgical History:  Procedure Laterality Date   BREAST BIOPSY Left 06/18/2015   BENIGN BREAST TISSUE WITH STROMAL FIBROSIS AND FIBROCYSTIC CHANGE.    BREAST BIOPSY Right 11/30/2018   affirm bx   ribbon marker   neg   BREAST BIOPSY Left 06/05/2023   [place] bx 2:00 9 cmfn, heart marker, path pending   BREAST BIOPSY Left 06/05/2023   [place] LT BREAST BX W LOC DEV 1ST LESION IMG BX SPEC US GUIDE 06/05/2023 [facility]   BREAST EXCISIONAL BIOPSY  Right 1989   COLONOSCOPY WITH PROPOFOL N/A 09/18/2015   Procedure: COLONOSCOPY WITH PROPOFOL;  Surgeon: Christena Deem, MD;  Location: [facility] ENDOSCOPY;  Service: Endoscopy;  Laterality: N/A;   COLONOSCOPY WITH PROPOFOL N/A 10/02/2021   Procedure: COLONOSCOPY WITH PROPOFOL;  Surgeon: Toledo, Boykin Nearing, MD;  Location: [facility] ENDOSCOPY;  Service: Gastroenterology;  Laterality: N/A;   ESOPHAGOGASTRODUODENOSCOPY (EGD) WITH PROPOFOL N/A 10/02/2021   Procedure: ESOPHAGOGASTRODUODENOSCOPY (EGD) WITH PROPOFOL;  Surgeon: Toledo, Boykin Nearing, MD;  Location: [facility] ENDOSCOPY;  Service: Gastroenterology;  Laterality: N/A;   TONSILLECTOMY     TUBAL LIGATION      Social History   Socioeconomic History   Marital status: Married    Spouse name: Not on file   Number of children: Not on file   Years of education: Not on file   Highest education level: Not on file  Occupational History   Not on file  Tobacco Use   Smoking status: Former    Current packs/day: 0.00    Average packs/day: 1 pack/day for 43.0 years (43.0  ttl pk-yrs)    Types: Cigarettes    Start date: 05/05/1971    Quit date: 05/04/2014    Years since quitting: 9.2   Smokeless tobacco: Never  Vaping Use   Vaping status: Never Used  Substance and Sexual Activity   Alcohol use: Not Currently    Comment: socially   Drug use: No   Sexual activity: Not on file  Other Topics Concern   Not on file  Social History Narrative   Lives with [place] Distance, husband. No indoor pets.   Social Drivers of Corporate investment banker Strain: Low Risk  (03/04/2023)   Received from [facility]   Overall Financial Resource Strain (CARDIA)    Difficulty of Paying Living Expenses: Not hard at all  Food Insecurity: No Food Insecurity (06/16/2023)   Hunger Vital Sign    Worried About Running Out of Food in the Last Year: Never true    Ran Out of Food in the Last Year: Never true  Transportation Needs: No Transportation Needs (06/16/2023)    PRAPARE - Administrator, Civil Service (Medical): No    Lack of Transportation (Non-Medical): No  Physical Activity: Not on file  Stress: Not on file  Social Connections: Not on file  Intimate Partner Violence: Not At Risk (06/16/2023)   Humiliation, Afraid, Rape, and Kick questionnaire    Fear of Current or Ex-Partner: No    Emotionally Abused: No    Physically Abused: No    Sexually Abused: No    Family History  Problem Relation Age of Onset   Breast cancer Mother    Hyperlipidemia Mother    Cancer Mother 33       breast   Mental illness Mother        Alzheimers Dementia   Hyperlipidemia Father    Hypertension Father      Current Outpatient Medications:    Ascorbic Acid (VITAMIN C) 1000 MG tablet, Take 1,000 mg by mouth daily., Disp: , Rfl:    aspirin 81 MG EC tablet, Take 325 mg by mouth daily.  , Disp: , Rfl:    atorvastatin (LIPITOR) 40 MG tablet, Take 1 tablet by mouth at bedtime., Disp: , Rfl:    Calcium Citrate-Vitamin D (CITRACAL + D PO), Take by mouth daily at 8 pm., Disp: , Rfl:    cetirizine (ZYRTEC) 10 MG tablet, Take 10 mg by mouth daily.  , Disp: , Rfl:    chlorthalidone (HYGROTON) 25 MG tablet, Take 1 tablet by mouth daily., Disp: , Rfl:    cholecalciferol (VITAMIN D) 1000 UNITS tablet, Take 1,000 Units by mouth daily.  , Disp: , Rfl:    cyanocobalamin (VITAMIN B12) 1000 MCG tablet, Take 1,000 mcg by mouth daily., Disp: , Rfl:    escitalopram (LEXAPRO) 5 MG tablet, Take 1 tablet (5 mg total) by mouth at bedtime., Disp: 30 tablet, Rfl: 0   ferrous sulfate 324 (65 Fe) MG TBEC, Take by mouth., Disp: , Rfl:    fish oil-omega-3 fatty acids 1000 MG capsule, Take 2 g by mouth daily.  , Disp: , Rfl:    fluticasone (FLONASE) 50 MCG/ACT nasal spray, USE 2 SPRAY IN EACH NOSTRIL EVERY DAY, Disp: 16 g, Rfl: 0   MULTIPLE VITAMIN PO, Take by mouth.  , Disp: , Rfl:    Multiple Vitamins-Minerals (MULTIVITAMIN ADULTS) TABS, Take by mouth., Disp: , Rfl:    traZODone  (DESYREL) 50 MG tablet, Take by mouth., Disp: , Rfl:  vitamin E 400 UNIT capsule, Take 400 Units by mouth daily.  , Disp: , Rfl:   Physical exam: There were no vitals filed for this visit. Physical Exam Cardiovascular:     Rate and Rhythm: Normal rate and regular rhythm.     Heart sounds: Normal heart sounds.  Pulmonary:     Effort: Pulmonary effort is normal.     Breath sounds: Normal breath sounds.  Skin:    General: Skin is warm and dry.  Neurological:     Mental Status: She is alert and oriented to person, place, and time.         Latest Ref Rng & Units 07/31/2023    9:32 AM  CMP  Glucose 70 - 99 mg/dL 94   BUN 8 - 23 mg/dL 12   Creatinine 1.61 - 1.00 mg/dL 0.96   Sodium 045 - 409 mmol/L 132   Potassium 3.5 - 5.1 mmol/L 3.4   Chloride 98 - 111 mmol/L 97   CO2 22 - 32 mmol/L 25   Calcium 8.9 - 10.3 mg/dL 8.9   Total Protein 6.5 - 8.1 g/dL 6.7   Total Bilirubin 0.0 - 1.2 mg/dL 0.9   Alkaline Phos 38 - 126 U/L 65   AST 15 - 41 U/L 29   ALT 0 - 44 U/L 27       Latest Ref Rng & Units 07/31/2023    9:32 AM  CBC  WBC 4.0 - 10.5 K/uL 5.6   Hemoglobin 12.0 - 15.0 g/dL 81.1   Hematocrit 91.4 - 46.0 % 42.3   Platelets 150 - 400 K/uL 241     No images are attached to the encounter.  CT BONE TROCAR/NEEDLE BIOPSY DEEP Result Date: 07/08/2023 INDICATION: Recent diagnosis of B-cell lymphoproliferative process. PET-CT demonstrates ill-defined hypermetabolic activity within the right iliac bone. Patient presents for CT-guided core biopsy to assess bone involvement. EXAM: CT-guided deep bone biopsy MEDICATIONS: None. ANESTHESIA/SEDATION: Moderate (conscious) sedation was employed during this procedure. A total of Versed 1.5 mg and Fentanyl 75 mcg was administered intravenously by the radiology nurse. Total intra-service moderate Sedation Time: 10 minutes. The patient's level of consciousness and vital signs were monitored continuously by radiology nursing throughout the procedure  under my direct supervision. COMPLICATIONS: None immediate. PROCEDURE: Informed written consent was obtained from the patient after a thorough discussion of the procedural risks, benefits and alternatives. All questions were addressed. Maximal Sterile Barrier Technique was utilized including caps, mask, sterile gowns, sterile gloves, sterile drape, hand hygiene and skin antiseptic. A timeout was performed prior to the initiation of the procedure. A planning axial CT scan was performed. The right iliac bone was localized. Confirmation was made with correlation from the prior PET-CT. Hypermetabolic region was selected and targeted. An appropriate skin entry site was marked. Local anesthesia was attained by infiltration with 1% lidocaine. A small dermatotomy was made. Under intermittent CT guidance, an 11 gauge trocar needle was advanced through the bone cortex utilizing the on control drill. Multiple 9 gauge core biopsies were then obtained of the region of interest again using the OnControl drill. Biopsy specimens were placed on a moistened Telfa pad and delivered to pathology for further analysis. IMPRESSION: Technically successful CT-guided biopsy of FDG avid region of the right iliac bone. Electronically Signed   By: Malachy Moan M.D.   On: 07/08/2023 15:17     Assessment and plan- Patient is a 73 y.o. female with history of stage IV low-grade follicular lymphoma with bone involvement  here for on treatment assessment prior to cycle 3 of weekly Rituxan chemotherapy  Counts okay to proceed with cycle 3 of Rituxan today.  She will directly proceed for cycle 4 next week which would be her last cycle for now.  Plan to repeat PET CT scan in about 4 to 5 weeks from now and I will see her 2 weeks following PET scan  Mild hypokalemia: Continue to monitor   Visit Diagnosis 1. Encounter for monitoring rituximab therapy   2. Grade I follicular lymphoma of extranodal site excluding spleen and other solid  organs [facility])      Dr. Owens Shark, MD, MPH [facility] [phone] 08/07/2023 8:51 AM

## 2023-08-07 NOTE — Patient Instructions (Signed)
 [facility] - A DEPT OF [facility]  Discharge Instructions: Thank you for choosing [facility] to provide your oncology and hematology care.  If you have a lab appointment with the Cancer Center, please go directly to the Cancer Center and check in at the registration area.  Wear comfortable clothing and clothing appropriate for easy access to any Portacath or PICC line.   We strive to give you quality time with your provider. You may need to reschedule your appointment if you arrive late (15 or more minutes).  Arriving late affects you and other patients whose appointments are after yours.  Also, if you miss three or more appointments without notifying the office, you may be dismissed from the clinic at the provider's discretion.      For prescription refill requests, have your pharmacy contact our office and allow 72 hours for refills to be completed.    Today you received the following chemotherapy and/or immunotherapy agents RITUXENCE      To help prevent nausea and vomiting after your treatment, we encourage you to take your nausea medication as directed.  BELOW ARE SYMPTOMS THAT SHOULD BE REPORTED IMMEDIATELY: *FEVER GREATER THAN 100.4 F (38 C) OR HIGHER *CHILLS OR SWEATING *NAUSEA AND VOMITING THAT IS NOT CONTROLLED WITH YOUR NAUSEA MEDICATION *UNUSUAL SHORTNESS OF BREATH *UNUSUAL BRUISING OR BLEEDING *URINARY PROBLEMS (pain or burning when urinating, or frequent urination) *BOWEL PROBLEMS (unusual diarrhea, constipation, pain near the anus) TENDERNESS IN MOUTH AND THROAT WITH OR WITHOUT PRESENCE OF ULCERS (sore throat, sores in mouth, or a toothache) UNUSUAL RASH, SWELLING OR PAIN  UNUSUAL VAGINAL DISCHARGE OR ITCHING   Items with * indicate a potential emergency and should be followed up as soon as possible or go to the Emergency Department if any problems should occur.  Please show the CHEMOTHERAPY ALERT CARD or IMMUNOTHERAPY  ALERT CARD at check-in to the Emergency Department and triage nurse.  Should you have questions after your visit or need to cancel or reschedule your appointment, please contact [facility] - A DEPT OF [facility]  [phone] and follow the prompts.  Office hours are 8:00 a.m. to 4:30 p.m. Monday - Friday. Please note that voicemails left after 4:00 p.m. may not be returned until the following business day.  We are closed weekends and major holidays. You have access to a nurse at all times for urgent questions. Please call the main number to the clinic [phone] and follow the prompts.  For any non-urgent questions, you may also contact your provider using MyChart. We now offer e-Visits for anyone 58 and older to request care online for non-urgent symptoms. For details visit [email or web address].   Also download the MyChart app! Go to the app store, search "MyChart", open the app, select [facility], and log in with your MyChart username and password.  Rituximab Injection What is this medication? RITUXIMAB (ri TUX i mab) treats leukemia and lymphoma. It works by blocking a protein that causes cancer cells to grow and multiply. This helps to slow or stop the spread of cancer cells. It may also be used to treat autoimmune conditions, such as arthritis. It works by slowing down an overactive immune system. It is a monoclonal antibody. This medicine may be used for other purposes; ask your health care provider or pharmacist if you have questions. COMMON BRAND NAME(S): RIABNI, Rituxan, RUXIENCE, truxima What should I tell my care  team before I take this medication? They need to know if you have any of these conditions: Chest pain Heart disease Immune system problems Infection, such as chickenpox, cold sores, hepatitis B, herpes Irregular heartbeat or rhythm Kidney disease Low blood counts, such as low white cells, platelets, red cells Lung disease Recent or  upcoming vaccine An unusual or allergic reaction to rituximab, other medications, foods, dyes, or preservatives Pregnant or trying to get pregnant Breast-feeding How should I use this medication? This medication is injected into a vein. It is given by a care team in a hospital or clinic setting. A special MedGuide will be given to you before each treatment. Be sure to read this information carefully each time. Talk to your care team about the use of this medication in children. While this medication may be prescribed for children as young as 6 months for selected conditions, precautions do apply. Overdosage: If you think you have taken too much of this medicine contact a poison control center or emergency room at once. NOTE: This medicine is only for you. Do not share this medicine with others. What if I miss a dose? Keep appointments for follow-up doses. It is important not to miss your dose. Call your care team if you are unable to keep an appointment. What may interact with this medication? Do not take this medication with any of the following: Live vaccines This medication may also interact with the following: Cisplatin This list may not describe all possible interactions. Give your health care provider a list of all the medicines, herbs, non-prescription drugs, or dietary supplements you use. Also tell them if you smoke, drink alcohol, or use illegal drugs. Some items may interact with your medicine. What should I watch for while using this medication? Your condition will be monitored carefully while you are receiving this medication. You may need blood work while taking this medication. This medication can cause serious infusion reactions. To reduce the risk your care team may give you other medications to take before receiving this one. Be sure to follow the directions from your care team. This medication may increase your risk of getting an infection. Call your care team for advice if  you get a fever, chills, sore throat, or other symptoms of a cold or flu. Do not treat yourself. Try to avoid being around people who are sick. Call your care team if you are around anyone with measles, chickenpox, or if you develop sores or blisters that do not heal properly. Avoid taking medications that contain aspirin, acetaminophen, ibuprofen, naproxen, or ketoprofen unless instructed by your care team. These medications may hide a fever. This medication may cause serious skin reactions. They can happen weeks to months after starting the medication. Contact your care team right away if you notice fevers or flu-like symptoms with a rash. The rash may be red or purple and then turn into blisters or peeling of the skin. You may also notice a red rash with swelling of the face, lips, or lymph nodes in your neck or under your arms. In some patients, this medication may cause a serious brain infection that may cause death. If you have any problems seeing, thinking, speaking, walking, or standing, tell your care team right away. If you cannot reach your care team, urgently seek another source of medical care. Talk to your care team if you may be pregnant. Serious birth defects can occur if you take this medication during pregnancy and for 12 months after the  last dose. You will need a negative pregnancy test before starting this medication. Contraception is recommended while taking this medication and for 12 months after the last dose. Your care team can help you find the option that works for you. Do not breastfeed while taking this medication and for at least 6 months after the last dose. What side effects may I notice from receiving this medication? Side effects that you should report to your care team as soon as possible: Allergic reactions or angioedema--skin rash, itching or hives, swelling of the face, eyes, lips, tongue, arms, or legs, trouble swallowing or breathing Bowel blockage--stomach cramping,  unable to have a bowel movement or pass gas, loss of appetite, vomiting Dizziness, loss of balance or coordination, confusion or trouble speaking Heart attack--pain or tightness in the chest, shoulders, arms, or jaw, nausea, shortness of breath, cold or clammy skin, feeling faint or lightheaded Heart rhythm changes--fast or irregular heartbeat, dizziness, feeling faint or lightheaded, chest pain, trouble breathing Infection--fever, chills, cough, sore throat, wounds that don't heal, pain or trouble when passing urine, general feeling of discomfort or being unwell Infusion reactions--chest pain, shortness of breath or trouble breathing, feeling faint or lightheaded Kidney injury--decrease in the amount of urine, swelling of the ankles, hands, or feet Liver injury--right upper belly pain, loss of appetite, nausea, light-colored stool, dark yellow or brown urine, yellowing skin or eyes, unusual weakness or fatigue Redness, blistering, peeling, or loosening of the skin, including inside the mouth Stomach pain that is severe, does not go away, or gets worse Tumor lysis syndrome (TLS)--nausea, vomiting, diarrhea, decrease in the amount of urine, dark urine, unusual weakness or fatigue, confusion, muscle pain or cramps, fast or irregular heartbeat, joint pain Side effects that usually do not require medical attention (report to your care team if they continue or are bothersome): Headache Joint pain Nausea Runny or stuffy nose Unusual weakness or fatigue This list may not describe all possible side effects. Call your doctor for medical advice about side effects. You may report side effects to FDA at 1-800-FDA-1088. Where should I keep my medication? This medication is given in a hospital or clinic. It will not be stored at home. NOTE: This sheet is a summary. It may not cover all possible information. If you have questions about this medicine, talk to your doctor, pharmacist, or health care provider.   2024 Elsevier/Gold Standard (2021-09-26 00:00:00)

## 2023-08-08 ENCOUNTER — Other Ambulatory Visit: Payer: Self-pay

## 2023-08-10 NOTE — Progress Notes (Signed)
 Late MAR entry  3/21@ 1056- Rituxan increased to 200cc/hr-- Bp 141/76.  Pt feels fine.

## 2023-08-14 ENCOUNTER — Inpatient Hospital Stay: Admitting: Oncology

## 2023-08-14 ENCOUNTER — Ambulatory Visit

## 2023-08-14 ENCOUNTER — Inpatient Hospital Stay

## 2023-08-14 VITALS — BP 163/67 | HR 53 | Temp 97.5°F | Resp 16

## 2023-08-14 DIAGNOSIS — C8200 Follicular lymphoma grade I, unspecified site: Secondary | ICD-10-CM

## 2023-08-14 DIAGNOSIS — Z5112 Encounter for antineoplastic immunotherapy: Secondary | ICD-10-CM | POA: Diagnosis not present

## 2023-08-14 MED ORDER — ACETAMINOPHEN 325 MG PO TABS
650.0000 mg | ORAL_TABLET | Freq: Once | ORAL | Status: AC
  Administered 2023-08-14: 650 mg via ORAL
  Filled 2023-08-14: qty 2

## 2023-08-14 MED ORDER — DIPHENHYDRAMINE HCL 25 MG PO CAPS
50.0000 mg | ORAL_CAPSULE | Freq: Once | ORAL | Status: AC
  Administered 2023-08-14: 50 mg via ORAL
  Filled 2023-08-14: qty 2

## 2023-08-14 MED ORDER — SODIUM CHLORIDE 0.9 % IV SOLN
INTRAVENOUS | Status: DC
Start: 2023-08-14 — End: 2023-08-14
  Filled 2023-08-14: qty 250

## 2023-08-14 MED ORDER — RITUXIMAB-PVVR CHEMO 500 MG/50ML IV SOLN
375.0000 mg/m2 | Freq: Once | INTRAVENOUS | Status: AC
  Administered 2023-08-14: 600 mg via INTRAVENOUS
  Filled 2023-08-14: qty 10

## 2023-08-14 NOTE — Patient Instructions (Signed)
 [facility] - A DEPT OF [facility]  Discharge Instructions: Thank you for choosing [facility] to provide your oncology and hematology care.  If you have a lab appointment with the Cancer Center, please go directly to the Cancer Center and check in at the registration area.  Wear comfortable clothing and clothing appropriate for easy access to any Portacath or PICC line.   We strive to give you quality time with your provider. You may need to reschedule your appointment if you arrive late (15 or more minutes).  Arriving late affects you and other patients whose appointments are after yours.  Also, if you miss three or more appointments without notifying the office, you may be dismissed from the clinic at the provider's discretion.      For prescription refill requests, have your pharmacy contact our office and allow 72 hours for refills to be completed.    Today you received the following chemotherapy and/or immunotherapy agents RITUXENCE      To help prevent nausea and vomiting after your treatment, we encourage you to take your nausea medication as directed.  BELOW ARE SYMPTOMS THAT SHOULD BE REPORTED IMMEDIATELY: *FEVER GREATER THAN 100.4 F (38 C) OR HIGHER *CHILLS OR SWEATING *NAUSEA AND VOMITING THAT IS NOT CONTROLLED WITH YOUR NAUSEA MEDICATION *UNUSUAL SHORTNESS OF BREATH *UNUSUAL BRUISING OR BLEEDING *URINARY PROBLEMS (pain or burning when urinating, or frequent urination) *BOWEL PROBLEMS (unusual diarrhea, constipation, pain near the anus) TENDERNESS IN MOUTH AND THROAT WITH OR WITHOUT PRESENCE OF ULCERS (sore throat, sores in mouth, or a toothache) UNUSUAL RASH, SWELLING OR PAIN  UNUSUAL VAGINAL DISCHARGE OR ITCHING   Items with * indicate a potential emergency and should be followed up as soon as possible or go to the Emergency Department if any problems should occur.  Please show the CHEMOTHERAPY ALERT CARD or IMMUNOTHERAPY  ALERT CARD at check-in to the Emergency Department and triage nurse.  Should you have questions after your visit or need to cancel or reschedule your appointment, please contact [facility] - A DEPT OF [facility]  [phone] and follow the prompts.  Office hours are 8:00 a.m. to 4:30 p.m. Monday - Friday. Please note that voicemails left after 4:00 p.m. may not be returned until the following business day.  We are closed weekends and major holidays. You have access to a nurse at all times for urgent questions. Please call the main number to the clinic [phone] and follow the prompts.  For any non-urgent questions, you may also contact your provider using MyChart. We now offer e-Visits for anyone 58 and older to request care online for non-urgent symptoms. For details visit [email or web address].   Also download the MyChart app! Go to the app store, search "MyChart", open the app, select [facility], and log in with your MyChart username and password.  Rituximab Injection What is this medication? RITUXIMAB (ri TUX i mab) treats leukemia and lymphoma. It works by blocking a protein that causes cancer cells to grow and multiply. This helps to slow or stop the spread of cancer cells. It may also be used to treat autoimmune conditions, such as arthritis. It works by slowing down an overactive immune system. It is a monoclonal antibody. This medicine may be used for other purposes; ask your health care provider or pharmacist if you have questions. COMMON BRAND NAME(S): RIABNI, Rituxan, RUXIENCE, truxima What should I tell my care  team before I take this medication? They need to know if you have any of these conditions: Chest pain Heart disease Immune system problems Infection, such as chickenpox, cold sores, hepatitis B, herpes Irregular heartbeat or rhythm Kidney disease Low blood counts, such as low white cells, platelets, red cells Lung disease Recent or  upcoming vaccine An unusual or allergic reaction to rituximab, other medications, foods, dyes, or preservatives Pregnant or trying to get pregnant Breast-feeding How should I use this medication? This medication is injected into a vein. It is given by a care team in a hospital or clinic setting. A special MedGuide will be given to you before each treatment. Be sure to read this information carefully each time. Talk to your care team about the use of this medication in children. While this medication may be prescribed for children as young as 6 months for selected conditions, precautions do apply. Overdosage: If you think you have taken too much of this medicine contact a poison control center or emergency room at once. NOTE: This medicine is only for you. Do not share this medicine with others. What if I miss a dose? Keep appointments for follow-up doses. It is important not to miss your dose. Call your care team if you are unable to keep an appointment. What may interact with this medication? Do not take this medication with any of the following: Live vaccines This medication may also interact with the following: Cisplatin This list may not describe all possible interactions. Give your health care provider a list of all the medicines, herbs, non-prescription drugs, or dietary supplements you use. Also tell them if you smoke, drink alcohol, or use illegal drugs. Some items may interact with your medicine. What should I watch for while using this medication? Your condition will be monitored carefully while you are receiving this medication. You may need blood work while taking this medication. This medication can cause serious infusion reactions. To reduce the risk your care team may give you other medications to take before receiving this one. Be sure to follow the directions from your care team. This medication may increase your risk of getting an infection. Call your care team for advice if  you get a fever, chills, sore throat, or other symptoms of a cold or flu. Do not treat yourself. Try to avoid being around people who are sick. Call your care team if you are around anyone with measles, chickenpox, or if you develop sores or blisters that do not heal properly. Avoid taking medications that contain aspirin, acetaminophen, ibuprofen, naproxen, or ketoprofen unless instructed by your care team. These medications may hide a fever. This medication may cause serious skin reactions. They can happen weeks to months after starting the medication. Contact your care team right away if you notice fevers or flu-like symptoms with a rash. The rash may be red or purple and then turn into blisters or peeling of the skin. You may also notice a red rash with swelling of the face, lips, or lymph nodes in your neck or under your arms. In some patients, this medication may cause a serious brain infection that may cause death. If you have any problems seeing, thinking, speaking, walking, or standing, tell your care team right away. If you cannot reach your care team, urgently seek another source of medical care. Talk to your care team if you may be pregnant. Serious birth defects can occur if you take this medication during pregnancy and for 12 months after the  last dose. You will need a negative pregnancy test before starting this medication. Contraception is recommended while taking this medication and for 12 months after the last dose. Your care team can help you find the option that works for you. Do not breastfeed while taking this medication and for at least 6 months after the last dose. What side effects may I notice from receiving this medication? Side effects that you should report to your care team as soon as possible: Allergic reactions or angioedema--skin rash, itching or hives, swelling of the face, eyes, lips, tongue, arms, or legs, trouble swallowing or breathing Bowel blockage--stomach cramping,  unable to have a bowel movement or pass gas, loss of appetite, vomiting Dizziness, loss of balance or coordination, confusion or trouble speaking Heart attack--pain or tightness in the chest, shoulders, arms, or jaw, nausea, shortness of breath, cold or clammy skin, feeling faint or lightheaded Heart rhythm changes--fast or irregular heartbeat, dizziness, feeling faint or lightheaded, chest pain, trouble breathing Infection--fever, chills, cough, sore throat, wounds that don't heal, pain or trouble when passing urine, general feeling of discomfort or being unwell Infusion reactions--chest pain, shortness of breath or trouble breathing, feeling faint or lightheaded Kidney injury--decrease in the amount of urine, swelling of the ankles, hands, or feet Liver injury--right upper belly pain, loss of appetite, nausea, light-colored stool, dark yellow or brown urine, yellowing skin or eyes, unusual weakness or fatigue Redness, blistering, peeling, or loosening of the skin, including inside the mouth Stomach pain that is severe, does not go away, or gets worse Tumor lysis syndrome (TLS)--nausea, vomiting, diarrhea, decrease in the amount of urine, dark urine, unusual weakness or fatigue, confusion, muscle pain or cramps, fast or irregular heartbeat, joint pain Side effects that usually do not require medical attention (report to your care team if they continue or are bothersome): Headache Joint pain Nausea Runny or stuffy nose Unusual weakness or fatigue This list may not describe all possible side effects. Call your doctor for medical advice about side effects. You may report side effects to FDA at 1-800-FDA-1088. Where should I keep my medication? This medication is given in a hospital or clinic. It will not be stored at home. NOTE: This sheet is a summary. It may not cover all possible information. If you have questions about this medicine, talk to your doctor, pharmacist, or health care provider.   2024 Elsevier/Gold Standard (2021-09-26 00:00:00)

## 2023-08-26 DIAGNOSIS — D508 Other iron deficiency anemias: Secondary | ICD-10-CM | POA: Diagnosis not present

## 2023-08-26 DIAGNOSIS — E7800 Pure hypercholesterolemia, unspecified: Secondary | ICD-10-CM | POA: Diagnosis not present

## 2023-08-28 ENCOUNTER — Other Ambulatory Visit: Payer: Self-pay

## 2023-08-28 ENCOUNTER — Telehealth: Payer: Self-pay

## 2023-08-28 ENCOUNTER — Encounter: Payer: Self-pay | Admitting: Oncology

## 2023-08-28 MED ORDER — STERILE WATER FOR INJECTION IJ SOLN
5.0000 mL | Freq: Four times a day (QID) | OROMUCOSAL | 3 refills | Status: DC
  Filled 2023-08-28: qty 480, 12d supply, fill #0

## 2023-08-28 NOTE — Telephone Encounter (Signed)
 Can you send her a prescription for magic mouthwash?

## 2023-08-28 NOTE — Telephone Encounter (Signed)
 Outbound call to patient to let her know that MMW has been sent to the [facility].  Patient inquired if this particular solution causes tooth discoloration; I informed it should not but in the event any changes / concerns arise to contact the clinic.  Patient verbalized agreement.

## 2023-09-02 DIAGNOSIS — E7800 Pure hypercholesterolemia, unspecified: Secondary | ICD-10-CM | POA: Diagnosis not present

## 2023-09-02 DIAGNOSIS — F5101 Primary insomnia: Secondary | ICD-10-CM | POA: Diagnosis not present

## 2023-09-02 DIAGNOSIS — F418 Other specified anxiety disorders: Secondary | ICD-10-CM | POA: Diagnosis not present

## 2023-09-02 DIAGNOSIS — C8202 Follicular lymphoma grade I, intrathoracic lymph nodes: Secondary | ICD-10-CM | POA: Diagnosis not present

## 2023-09-02 DIAGNOSIS — E871 Hypo-osmolality and hyponatremia: Secondary | ICD-10-CM | POA: Diagnosis not present

## 2023-09-02 DIAGNOSIS — I10 Essential (primary) hypertension: Secondary | ICD-10-CM | POA: Diagnosis not present

## 2023-09-09 ENCOUNTER — Ambulatory Visit
Admission: RE | Admit: 2023-09-09 | Discharge: 2023-09-09 | Disposition: A | Discharge status: Home / Self Care | Source: Ambulatory Visit | Attending: Oncology | Admitting: Oncology

## 2023-09-09 DIAGNOSIS — Z5181 Encounter for therapeutic drug level monitoring: Secondary | ICD-10-CM | POA: Insufficient documentation

## 2023-09-09 DIAGNOSIS — I700 Atherosclerosis of aorta: Secondary | ICD-10-CM | POA: Insufficient documentation

## 2023-09-09 DIAGNOSIS — C8590 Non-Hodgkin lymphoma, unspecified, unspecified site: Secondary | ICD-10-CM | POA: Diagnosis not present

## 2023-09-09 DIAGNOSIS — I2510 Atherosclerotic heart disease of native coronary artery without angina pectoris: Secondary | ICD-10-CM | POA: Diagnosis not present

## 2023-09-09 DIAGNOSIS — C8209 Follicular lymphoma grade I, extranodal and solid organ sites: Secondary | ICD-10-CM | POA: Insufficient documentation

## 2023-09-09 DIAGNOSIS — Z79899 Other long term (current) drug therapy: Secondary | ICD-10-CM | POA: Diagnosis not present

## 2023-09-09 DIAGNOSIS — M4316 Spondylolisthesis, lumbar region: Secondary | ICD-10-CM | POA: Diagnosis not present

## 2023-09-09 LAB — GLUCOSE, CAPILLARY: Glucose-Capillary: 108 mg/dL — ABNORMAL HIGH (ref 70–99)

## 2023-09-09 MED ORDER — FLUDEOXYGLUCOSE F - 18 (FDG) INJECTION
6.4100 | Freq: Once | INTRAVENOUS | Status: AC | PRN
  Administered 2023-09-09: 6.41 via INTRAVENOUS

## 2023-09-25 ENCOUNTER — Inpatient Hospital Stay: Attending: Oncology | Admitting: Oncology

## 2023-09-25 ENCOUNTER — Encounter: Payer: Self-pay | Admitting: Oncology

## 2023-09-25 DIAGNOSIS — Z803 Family history of malignant neoplasm of breast: Secondary | ICD-10-CM | POA: Insufficient documentation

## 2023-09-25 DIAGNOSIS — Z87891 Personal history of nicotine dependence: Secondary | ICD-10-CM | POA: Diagnosis not present

## 2023-09-25 DIAGNOSIS — C820A FollIcular lymphoma grade i, in remission: Secondary | ICD-10-CM | POA: Insufficient documentation

## 2023-09-25 DIAGNOSIS — C8200 Follicular lymphoma grade I, unspecified site: Secondary | ICD-10-CM

## 2023-09-25 DIAGNOSIS — Z79899 Other long term (current) drug therapy: Secondary | ICD-10-CM | POA: Diagnosis not present

## 2023-09-25 DIAGNOSIS — M549 Dorsalgia, unspecified: Secondary | ICD-10-CM | POA: Diagnosis not present

## 2023-09-25 NOTE — Progress Notes (Signed)
 Patient states the past few weeks she will have random pains in her thighs shooting up to the buttocks. Patient says pain some time stops her in her tracks. Patient states been really fatigue.

## 2023-09-25 NOTE — Progress Notes (Signed)
 Hematology/Oncology Consult note [facility]  [phone] [phone]  Patient Care Team: Monique Ano, MD as PCP - General (Family Medicine) Monique Ano, MD as Referring Physician (Family Medicine) Marshall Skeeter, MD (General Surgery)   Name of the patient: Nicole York  [id number]  August 09, 1950   Date of visit: 09/25/23  Diagnosis-low-grade follicular lymphoma stage IV currently in remission  Chief complaint/ Reason for visit-discuss PET scan results and further management  Heme/Onc history:  Patient is a 73 year old female with a past medical history signal taken for hyperlipidemia osteopenia IBS who underwent CT chest as a part of lung cancer screening program which incidentally showed a 9 mm nodule in the lateral aspect of the left breast which was more conspicuous as compared to what was noted on CT in October 2023 when it was 6 mm.  This was followed by a bilateral diagnostic mammogram which showed that this 9 mm mass appears to be an intramammary lymph node in the left upper outer breast.  There were no suspicious breast masses noted in the left breast.  No findings suggestive of malignancy in the right breast.  Patient underwent core biopsy of this intramammary lymph node which showed atypical nodular B-cell infiltrate favoring a lymphoproliferative disorder.   THE SPECIMEN CONSISTS OF FRAGMENTED CORES OF LYMPHOID TISSUE WITH A      NODULAR ARCHITECTURE CONSISTING OF AN MOSTLY  UNPOLARIZED FOLLICLES BUT WITH      RARE TINGIBLE BODY MACROPHAGE.  CD20 HIGHLIGHTS THE FOLLICLES AS WELL AS      PARAFOLLICULAR SMALL B CELL WHICH ARE ALSO PREDOMINANTLY CD10 AND BCL6 POSITIVE.      BCL-2 IS ALSO POSITIVE IN THE MAJORITY OF THE CELLS, BUT IS FOCALLY WEAK/ABSENT      IN AREAS OF THE FOLLICLES.  CD23 HIGHLIGHTS AND EXPANDED FOLLICULAR DENDRITIC      CELL MESHWORK AS WELL AS POSITIVITY ON THE B CELLS.  THE PROLIFERATION RATE BY       KI-67 IS LOW (LESS THAN 5%) AND IS ABNORMALLY LOW IN THE AREA OF FOLLICLES.      CD3/CD5/CD43 HIGHLIGHTS SMALL BACKGROUND T CELLS.  CYCLIN D1 IS NEGATIVE.  FLOW      CYTOMETRY SHOWS A DUAL POPULATION OF B CELLS WITH A SUBSET SHOWING POLYCLONAL B      CELLS AND ANOTHER SUBSET THAT IS CD10 POSITIVE AND OVERALL APPEARS DIM FOR      LAMBDA BUT NOT DEFINITIVELY PATHOGNOMONIC FOR LAMBDA RESTRICTION.  THE OVERALL      FINDINGS ARE ATYPICAL AND FAVOR A LYMPHOPROLIFERATIVE DISORDER WITH A      DIFFERENTIAL DIAGNOSIS INCLUDING BOTH A FOLLICULAR LYMPHOMA (FAVORED) AS WELL AS      A MARGINAL ZONE LYMPHOMA WITH FOLLICULAR COLONIZATION.  TO FURTHER      ASSESS/CHARACTERIZE THE LESION MOLECULAR TESTING FOR AN IGH GENE REARRANGEMENT      AS WELL AS LOW-GRADE B-CELL LYMPHOMA.IgH/Bcl-2 consistent with morphologic impression of follicular lymphoma   PET CT scan showed multifocal areas of abnormal uptake in the skeleton including the ribs sternum multiple vertebral bodies and right humeral head.  Patient underwent bone biopsy involving the area of iliac bone surrounding the sacrum pathology showed atypical lymphoid infiltrate with lambda predominance suspicious for lymphoproliferative disorder but definitive diagnosis could not be made due to predominant crush artifact. The B cells do not appear to express CD5, CD10, CD21 or cyclin D1; however, there is some nonspecific staining present. Bcl-2 and Bcl-6 are positive but difficult  to assess a distinct population of B-cells. Kappa and lambda by in situ hybridization highlight polytypic plasma cells.  EBV by in situ hybridization is negative.  Flow cytometry performed on the sample ([id number]) identifies a B-cell population (CD19, CD20, CD22) with lambda excess comprising 13% of lymphocytes. Overall, the features are worrisome for a lymphoproliferative process     Patient had 4 weekly cycles of Rituxan  in March 2025  Interval history-patient reports occasional low back  pain that radiates to her right or left thigh.  Sometimes pain bothers her at night.  ECOG PS- 0 Pain scale- 0   Review of systems- Review of Systems  Constitutional:  Negative for chills, fever, malaise/fatigue and weight loss.  HENT:  Negative for congestion, ear discharge and nosebleeds.   Eyes:  Negative for blurred vision.  Respiratory:  Negative for cough, hemoptysis, sputum production, shortness of breath and wheezing.   Cardiovascular:  Negative for chest pain, palpitations, orthopnea and claudication.  Gastrointestinal:  Negative for abdominal pain, blood in stool, constipation, diarrhea, heartburn, melena, nausea and vomiting.  Genitourinary:  Negative for dysuria, flank pain, frequency, hematuria and urgency.  Musculoskeletal:  Negative for back pain, joint pain and myalgias.  Skin:  Negative for rash.  Neurological:  Negative for dizziness, tingling, focal weakness, seizures, weakness and headaches.  Endo/Heme/Allergies:  Does not bruise/bleed easily.  Psychiatric/Behavioral:  Negative for depression and suicidal ideas. The patient does not have insomnia.       No Known Allergies   Past Medical History:  Diagnosis Date   Allergy     Depression    Eczematous dermatitis of eyelid    History of chicken pox    Hyperlipidemia    IBS (irritable bowel syndrome)    Osteopenia      Past Surgical History:  Procedure Laterality Date   BREAST BIOPSY Left 06/18/2015   BENIGN BREAST TISSUE WITH STROMAL FIBROSIS AND FIBROCYSTIC CHANGE.    BREAST BIOPSY Right 11/30/2018   affirm bx   ribbon marker   neg   BREAST BIOPSY Left 06/05/2023   us  bx 2:00 9 cmfn, heart marker, path pending   BREAST BIOPSY Left 06/05/2023   US  LT BREAST BX W LOC DEV 1ST LESION IMG BX SPEC US  GUIDE 06/05/2023 [facility]   BREAST EXCISIONAL BIOPSY Right 1989   COLONOSCOPY WITH PROPOFOL  N/A 09/18/2015   Procedure: COLONOSCOPY WITH PROPOFOL ;  Surgeon: Deveron Fly, MD;  Location: [facility]  ENDOSCOPY;  Service: Endoscopy;  Laterality: N/A;   COLONOSCOPY WITH PROPOFOL  N/A 10/02/2021   Procedure: COLONOSCOPY WITH PROPOFOL ;  Surgeon: Toledo, Alphonsus Jeans, MD;  Location: [facility] ENDOSCOPY;  Service: Gastroenterology;  Laterality: N/A;   ESOPHAGOGASTRODUODENOSCOPY (EGD) WITH PROPOFOL  N/A 10/02/2021   Procedure: ESOPHAGOGASTRODUODENOSCOPY (EGD) WITH PROPOFOL ;  Surgeon: Toledo, Alphonsus Jeans, MD;  Location: [facility] ENDOSCOPY;  Service: Gastroenterology;  Laterality: N/A;   TONSILLECTOMY     TUBAL LIGATION      Social History   Socioeconomic History   Marital status: Married    Spouse name: Not on file   Number of children: Not on file   Years of education: Not on file   Highest education level: Not on file  Occupational History   Not on file  Tobacco Use   Smoking status: Former    Current packs/day: 0.00    Average packs/day: 1 pack/day for 43.0 years (43.0 ttl pk-yrs)    Types: Cigarettes    Start date: 05/05/1971    Quit date: 05/04/2014    Years since quitting: 9.4  Smokeless tobacco: Never  Vaping Use   Vaping status: Never Used  Substance and Sexual Activity   Alcohol use: Not Currently    Comment: socially   Drug use: No   Sexual activity: Not on file  Other Topics Concern   Not on file  Social History Narrative   Lives with Charlann Confer, husband. No indoor pets.   Social Drivers of Corporate investment banker Strain: Low Risk  (03/04/2023)   Received [facility]   Overall Financial Resource Strain (CARDIA)    Difficulty of Paying Living Expenses: Not hard at all  Food Insecurity: No Food Insecurity (06/16/2023)   Hunger Vital Sign    Worried About Running Out of Food in the Last Year: Never true    Ran Out of Food in the Last Year: Never true  Transportation Needs: No Transportation Needs (06/16/2023)   PRAPARE - Administrator, Civil Service (Medical): No    Lack of Transportation (Non-Medical): No  Physical Activity: Not on file   Stress: Not on file  Social Connections: Not on file  Intimate Partner Violence: Not At Risk (06/16/2023)   Humiliation, Afraid, Rape, and Kick questionnaire    Fear of Current or Ex-Partner: No    Emotionally Abused: No    Physically Abused: No    Sexually Abused: No    Family History  Problem Relation Age of Onset   Breast cancer Mother    Hyperlipidemia Mother    Cancer Mother 80       breast   Mental illness Mother        Alzheimers Dementia   Hyperlipidemia Father    Hypertension Father      Current Outpatient Medications:    Ascorbic Acid (VITAMIN C) 1000 MG tablet, Take 1,000 mg by mouth daily., Disp: , Rfl:    aspirin 81 MG EC tablet, Take 325 mg by mouth daily.  , Disp: , Rfl:    atorvastatin (LIPITOR) 40 MG tablet, Take 1 tablet by mouth at bedtime., Disp: , Rfl:    Calcium Citrate-Vitamin D  (CITRACAL + D PO), Take by mouth daily at 8 pm., Disp: , Rfl:    cetirizine (ZYRTEC) 10 MG tablet, Take 10 mg by mouth daily.  , Disp: , Rfl:    chlorthalidone (HYGROTON) 25 MG tablet, Take 1 tablet by mouth daily., Disp: , Rfl:    cholecalciferol (VITAMIN D ) 1000 UNITS tablet, Take 1,000 Units by mouth daily.  , Disp: , Rfl:    cyanocobalamin (VITAMIN B12) 1000 MCG tablet, Take 1,000 mcg by mouth daily., Disp: , Rfl:    escitalopram  (LEXAPRO ) 5 MG tablet, Take 1 tablet (5 mg total) by mouth at bedtime. (Patient taking differently: Take 10 mg by mouth at bedtime.), Disp: 30 tablet, Rfl: 0   ferrous sulfate 324 (65 Fe) MG TBEC, Take by mouth., Disp: , Rfl:    fish oil-omega-3 fatty acids 1000 MG capsule, Take 2 g by mouth daily.  , Disp: , Rfl:    fluticasone  (FLONASE ) 50 MCG/ACT nasal spray, USE 2 SPRAY IN EACH NOSTRIL EVERY DAY, Disp: 16 g, Rfl: 0   MULTIPLE VITAMIN PO, Take by mouth.  , Disp: , Rfl:    Multiple Vitamins-Minerals (MULTIVITAMIN ADULTS) TABS, Take by mouth., Disp: , Rfl:    traZODone (DESYREL) 50 MG tablet, Take by mouth., Disp: , Rfl:    vitamin E 400 UNIT  capsule, Take 400 Units by mouth daily.  , Disp: , Rfl:  magic mouthwash (multi-ingredient) oral suspension, Swish and swallow 5-10 mLs 4 (four) times daily., Disp: 480 mL, Rfl: 3   magic mouthwash (nystatin , lidocaine , diphenhydrAMINE ) suspension, Take 5 mLs by mouth., Disp: , Rfl:   Physical exam:  Vitals:   09/25/23 1027  BP: 121/65  Pulse: 67  Resp: 20  Temp: 98.6 F (37 C)  SpO2: 100%  Weight: 125 lb 9.6 oz (57 kg)   Physical Exam Cardiovascular:     Rate and Rhythm: Normal rate and regular rhythm.     Heart sounds: Normal heart sounds.  Pulmonary:     Effort: Pulmonary effort is normal.     Breath sounds: Normal breath sounds.  Skin:    General: Skin is warm and dry.  Neurological:     Mental Status: She is alert and oriented to person, place, and time.      I have personally reviewed labs listed below:    Latest Ref Rng & Units 08/07/2023    8:42 AM  CMP  Glucose 70 - 99 mg/dL 93   BUN 8 - 23 mg/dL 13   Creatinine 1.61 - 1.00 mg/dL 0.96   Sodium 045 - 409 mmol/L 135   Potassium 3.5 - 5.1 mmol/L 3.3   Chloride 98 - 111 mmol/L 99   CO2 22 - 32 mmol/L 27   Calcium 8.9 - 10.3 mg/dL 9.1   Total Protein 6.5 - 8.1 g/dL 6.8   Total Bilirubin 0.0 - 1.2 mg/dL 0.7   Alkaline Phos 38 - 126 U/L 69   AST 15 - 41 U/L 31   ALT 0 - 44 U/L 28       Latest Ref Rng & Units 08/07/2023    8:42 AM  CBC  WBC 4.0 - 10.5 K/uL 6.2   Hemoglobin 12.0 - 15.0 g/dL 81.1   Hematocrit 91.4 - 46.0 % 43.0   Platelets 150 - 400 K/uL 256    I have personally reviewed Radiology images listed below: No images are attached to the encounter.  NM PET Image Restag (PS) Skull Base To Thigh Result Date: 09/17/2023 CLINICAL DATA:  Subsequent treatment strategy for follicular lymphoma. EXAM: NUCLEAR MEDICINE PET SKULL BASE TO THIGH TECHNIQUE: 6.4 mCi F-18 FDG was injected intravenously. Full-ring PET imaging was performed from the skull base to thigh after the radiotracer. CT data was obtained and  used for attenuation correction and anatomic localization. Fasting blood glucose: 108 mg/dl COMPARISON:  78/29/5621 FINDINGS: Mediastinal blood pool activity: SUV max 2.0 Liver activity: SUV max 2.7 NECK: No significant abnormal hypermetabolic activity in this region. [place] 1. Incidental CT findings: Old left inferior orbital wall fracture. Bilateral common carotid atheromatous vascular calcification. CHEST: No pathologically enlarged or hypermetabolic thoracic lymph nodes are currently identified. [place] 1. Incidental CT findings: Coronary, aortic arch, and branch vessel atherosclerotic vascular disease. ABDOMEN/PELVIS: No significant abnormal hypermetabolic activity in this region. Incidental CT findings: Atherosclerosis is present, including aortoiliac atherosclerotic disease. SKELETON: No significant abnormal hypermetabolic activity in this region. Previous scattered skeletal activity resolved. Incidental CT findings: Grade 1 anterolisthesis at L4-5. IMPRESSION: 1. No findings of active lymphoma. 2. Coronary, aortic arch, and branch vessel atherosclerotic vascular disease. Aortic Atherosclerosis (ICD10-I70.0). 3. Grade 1 anterolisthesis at L4-5. 4. Old left inferior orbital wall fracture. Electronically Signed   By: Freida Jes M.D.   On: 09/17/2023 14:53     Assessment and plan- Patient is a 73 y.o. female with history of low-grade follicular lymphoma stage IV s/p 4 cycles of Rituxan   Here to discuss PET scan results and further management  I have reviewed PET CT scan images independently and discussed findings with the patient.  Her pretreatment PET scan showed multiple areas of bone involvement in addition to biopsy-proven intramammary lymph node.  After 4 cycles of Rituxan  PET scan currently shows no findings of active lymphoma.  Follicular lymphoma remains in remission at this point but patient understands that this can potentially recur in the future.  Treatment will be indicated at that  time only if there is evidence of systemic symptoms or organ compromise.  I will see her back in 4 months with CBC with differential CMP and LDH.  I will plan to get scans again in 6 months.  Patient complains of low back pain and that was mild grade 1 anterolisthesis noted at L4-L5 which may be the reason for her pain.  If pain does not get better she will need to reach out to her primary care doctor and may need Ortho referral   Visit Diagnosis 1. Follicular lymphoma grade I, unspecified body region [facility])      Dr. Seretha Dance, MD, MPH [facility] [phone] 09/25/2023 1:58 PM

## 2023-09-26 ENCOUNTER — Other Ambulatory Visit: Payer: Self-pay

## 2023-09-27 ENCOUNTER — Other Ambulatory Visit: Payer: Self-pay

## 2023-09-30 ENCOUNTER — Other Ambulatory Visit: Payer: Self-pay | Admitting: Family Medicine

## 2023-09-30 DIAGNOSIS — Z9189 Other specified personal risk factors, not elsewhere classified: Secondary | ICD-10-CM

## 2023-10-07 ENCOUNTER — Ambulatory Visit
Admission: RE | Admit: 2023-10-07 | Discharge: 2023-10-07 | Disposition: A | Discharge status: Home / Self Care | Payer: Self-pay | Source: Ambulatory Visit | Attending: Family Medicine | Admitting: Family Medicine

## 2023-10-07 DIAGNOSIS — Z9189 Other specified personal risk factors, not elsewhere classified: Secondary | ICD-10-CM | POA: Insufficient documentation

## 2023-10-27 DIAGNOSIS — I700 Atherosclerosis of aorta: Secondary | ICD-10-CM | POA: Diagnosis not present

## 2023-10-27 DIAGNOSIS — E7800 Pure hypercholesterolemia, unspecified: Secondary | ICD-10-CM | POA: Diagnosis not present

## 2023-10-27 DIAGNOSIS — I2584 Coronary atherosclerosis due to calcified coronary lesion: Secondary | ICD-10-CM | POA: Diagnosis not present

## 2023-10-27 DIAGNOSIS — I10 Essential (primary) hypertension: Secondary | ICD-10-CM | POA: Diagnosis not present

## 2023-10-27 DIAGNOSIS — I2510 Atherosclerotic heart disease of native coronary artery without angina pectoris: Secondary | ICD-10-CM | POA: Diagnosis not present

## 2023-11-11 DIAGNOSIS — I2510 Atherosclerotic heart disease of native coronary artery without angina pectoris: Secondary | ICD-10-CM | POA: Diagnosis not present

## 2023-11-11 DIAGNOSIS — I2584 Coronary atherosclerosis due to calcified coronary lesion: Secondary | ICD-10-CM | POA: Diagnosis not present

## 2023-11-17 DIAGNOSIS — E7800 Pure hypercholesterolemia, unspecified: Secondary | ICD-10-CM | POA: Diagnosis not present

## 2023-11-17 DIAGNOSIS — I10 Essential (primary) hypertension: Secondary | ICD-10-CM | POA: Diagnosis not present

## 2023-11-17 DIAGNOSIS — I2510 Atherosclerotic heart disease of native coronary artery without angina pectoris: Secondary | ICD-10-CM | POA: Diagnosis not present

## 2023-11-17 DIAGNOSIS — I700 Atherosclerosis of aorta: Secondary | ICD-10-CM | POA: Diagnosis not present

## 2023-12-16 DIAGNOSIS — M170 Bilateral primary osteoarthritis of knee: Secondary | ICD-10-CM | POA: Diagnosis not present

## 2023-12-23 DIAGNOSIS — M170 Bilateral primary osteoarthritis of knee: Secondary | ICD-10-CM | POA: Diagnosis not present

## 2023-12-30 DIAGNOSIS — M170 Bilateral primary osteoarthritis of knee: Secondary | ICD-10-CM | POA: Diagnosis not present

## 2024-01-12 DIAGNOSIS — H6122 Impacted cerumen, left ear: Secondary | ICD-10-CM | POA: Diagnosis not present

## 2024-01-12 DIAGNOSIS — H60331 Swimmer's ear, right ear: Secondary | ICD-10-CM | POA: Diagnosis not present

## 2024-01-12 DIAGNOSIS — H6041 Cholesteatoma of right external ear: Secondary | ICD-10-CM | POA: Diagnosis not present

## 2024-02-10 ENCOUNTER — Inpatient Hospital Stay (HOSPITAL_BASED_OUTPATIENT_CLINIC_OR_DEPARTMENT_OTHER): Admitting: Oncology

## 2024-02-10 ENCOUNTER — Inpatient Hospital Stay: Attending: Oncology

## 2024-02-10 ENCOUNTER — Encounter: Payer: Self-pay | Admitting: Oncology

## 2024-02-10 ENCOUNTER — Other Ambulatory Visit: Payer: Self-pay

## 2024-02-10 DIAGNOSIS — C8209 Follicular lymphoma grade I, extranodal and solid organ sites: Secondary | ICD-10-CM | POA: Diagnosis not present

## 2024-02-10 DIAGNOSIS — C8200 Follicular lymphoma grade I, unspecified site: Secondary | ICD-10-CM

## 2024-02-10 DIAGNOSIS — Z79899 Other long term (current) drug therapy: Secondary | ICD-10-CM | POA: Diagnosis not present

## 2024-02-10 DIAGNOSIS — M79604 Pain in right leg: Secondary | ICD-10-CM | POA: Diagnosis not present

## 2024-02-10 DIAGNOSIS — R197 Diarrhea, unspecified: Secondary | ICD-10-CM | POA: Diagnosis not present

## 2024-02-10 DIAGNOSIS — M79605 Pain in left leg: Secondary | ICD-10-CM | POA: Insufficient documentation

## 2024-02-10 DIAGNOSIS — Z87891 Personal history of nicotine dependence: Secondary | ICD-10-CM | POA: Diagnosis not present

## 2024-02-10 LAB — CMP (CANCER CENTER ONLY)
ALT: 27 U/L (ref 0–44)
AST: 34 U/L (ref 15–41)
Albumin: 3.9 g/dL (ref 3.5–5.0)
Alkaline Phosphatase: 74 U/L (ref 38–126)
Anion gap: 7 (ref 5–15)
BUN: 13 mg/dL (ref 8–23)
CO2: 26 mmol/L (ref 22–32)
Calcium: 8.9 mg/dL (ref 8.9–10.3)
Chloride: 99 mmol/L (ref 98–111)
Creatinine: 0.83 mg/dL (ref 0.44–1.00)
GFR, Estimated: >60 mL/min (ref >60)
Glucose, Bld: 129 mg/dL — ABNORMAL HIGH (ref 70–99)
Potassium: 3.6 mmol/L (ref 3.5–5.1)
Sodium: 132 mmol/L — ABNORMAL LOW (ref 135–145)
Total Bilirubin: 0.6 mg/dL (ref 0.0–1.2)
Total Protein: 6.9 g/dL (ref 6.5–8.1)

## 2024-02-10 LAB — CBC WITH DIFFERENTIAL (CANCER CENTER ONLY)
Abs Immature Granulocytes: 0.05 K/uL (ref 0.00–0.07)
Basophils Absolute: 0 K/uL (ref 0.0–0.1)
Basophils Relative: 1 %
Eosinophils Absolute: 0.1 K/uL (ref 0.0–0.5)
Eosinophils Relative: 1 %
HCT: 43.6 % (ref 36.0–46.0)
Hemoglobin: 14.7 g/dL (ref 12.0–15.0)
Immature Granulocytes: 1 %
Lymphocytes Relative: 21 %
Lymphs Abs: 1.5 K/uL (ref 0.7–4.0)
MCH: 29.6 pg (ref 26.0–34.0)
MCHC: 33.7 g/dL (ref 30.0–36.0)
MCV: 87.7 fL (ref 80.0–100.0)
Monocytes Absolute: 0.7 K/uL (ref 0.1–1.0)
Monocytes Relative: 10 %
Neutro Abs: 4.7 K/uL (ref 1.7–7.7)
Neutrophils Relative %: 66 %
Platelet Count: 265 K/uL (ref 150–400)
RBC: 4.97 MIL/uL (ref 3.87–5.11)
RDW: 14 % (ref 11.5–15.5)
WBC Count: 7 K/uL (ref 4.0–10.5)
nRBC: 0 % (ref 0.0–0.2)

## 2024-02-10 LAB — LACTATE DEHYDROGENASE: LDH: 155 U/L (ref 98–192)

## 2024-02-10 NOTE — Progress Notes (Signed)
 Patient wanting insight regarding her GI issues, she also states she's having really bad leg pain.

## 2024-02-10 NOTE — Progress Notes (Signed)
 Hematology/Oncology Consult note [facility]  [phone] [phone]  Patient Care Team: Alla Amis, MD as PCP - General (Family Medicine) Alla Amis, MD as Referring Physician (Family Medicine) Dessa Reyes ORN, MD (General Surgery)   Name of the patient: Nicole York  [id number]  03-20-1951   Date of visit: 02/10/24  Diagnosis- low-grade follicular lymphoma stage IV currently in remission    Chief complaint/ Reason for visit- routine f/u of follicular lymphoma  Heme/Onc history: Patient is a 73 year old female with a past medical history signal taken for hyperlipidemia osteopenia IBS who underwent CT chest as a part of lung cancer screening program which incidentally showed a 9 mm nodule in the lateral aspect of the left breast which was more conspicuous as compared to what was noted on CT in October 2023 when it was 6 mm.  This was followed by a bilateral diagnostic mammogram which showed that this 9 mm mass appears to be an intramammary lymph node in the left upper outer breast.  There were no suspicious breast masses noted in the left breast.  No findings suggestive of malignancy in the right breast.  Patient underwent core biopsy of this intramammary lymph node which showed atypical nodular B-cell infiltrate favoring a lymphoproliferative disorder.   THE SPECIMEN CONSISTS OF FRAGMENTED CORES OF LYMPHOID TISSUE WITH A      NODULAR ARCHITECTURE CONSISTING OF AN MOSTLY  UNPOLARIZED FOLLICLES BUT WITH      RARE TINGIBLE BODY MACROPHAGE.  CD20 HIGHLIGHTS THE FOLLICLES AS WELL AS      PARAFOLLICULAR SMALL B CELL WHICH ARE ALSO PREDOMINANTLY CD10 AND BCL6 POSITIVE.      BCL-2 IS ALSO POSITIVE IN THE MAJORITY OF THE CELLS, BUT IS FOCALLY WEAK/ABSENT      IN AREAS OF THE FOLLICLES.  CD23 HIGHLIGHTS AND EXPANDED FOLLICULAR DENDRITIC      CELL MESHWORK AS WELL AS POSITIVITY ON THE B CELLS.  THE PROLIFERATION RATE BY      KI-67  IS LOW (LESS THAN 5%) AND IS ABNORMALLY LOW IN THE AREA OF FOLLICLES.      CD3/CD5/CD43 HIGHLIGHTS SMALL BACKGROUND T CELLS.  CYCLIN D1 IS NEGATIVE.  FLOW      CYTOMETRY SHOWS A DUAL POPULATION OF B CELLS WITH A SUBSET SHOWING POLYCLONAL B      CELLS AND ANOTHER SUBSET THAT IS CD10 POSITIVE AND OVERALL APPEARS DIM FOR      LAMBDA BUT NOT DEFINITIVELY PATHOGNOMONIC FOR LAMBDA RESTRICTION.  THE OVERALL      FINDINGS ARE ATYPICAL AND FAVOR A LYMPHOPROLIFERATIVE DISORDER WITH A      DIFFERENTIAL DIAGNOSIS INCLUDING BOTH A FOLLICULAR LYMPHOMA (FAVORED) AS WELL AS      A MARGINAL ZONE LYMPHOMA WITH FOLLICULAR COLONIZATION.  TO FURTHER      ASSESS/CHARACTERIZE THE LESION MOLECULAR TESTING FOR AN IGH GENE REARRANGEMENT      AS WELL AS LOW-GRADE B-CELL LYMPHOMA.IgH/Bcl-2 consistent with morphologic impression of follicular lymphoma   PET CT scan showed multifocal areas of abnormal uptake in the skeleton including the ribs sternum multiple vertebral bodies and right humeral head.  Patient underwent bone biopsy involving the area of iliac bone surrounding the sacrum pathology showed atypical lymphoid infiltrate with lambda predominance suspicious for lymphoproliferative disorder but definitive diagnosis could not be made due to predominant crush artifact. The B cells do not appear to express CD5, CD10, CD21 or cyclin D1; however, there is some nonspecific staining present. Bcl-2 and Bcl-6 are positive but  difficult to assess a distinct population of B-cells. Kappa and lambda by in situ hybridization highlight polytypic plasma cells.  EBV by in situ hybridization is negative.  Flow cytometry performed on the sample ([id number]) identifies a B-cell population (CD19, CD20, CD22) with lambda excess comprising 13% of lymphocytes. Overall, the features are worrisome for a lymphoproliferative process      Patient had 4 weekly cycles of Rituxan  in March 2025  Interval history-patient has been having irregular bowel  movements mostly diarrhea requiring as needed Imodium.  She also wears depends because of this.  Also reports having bilateral leg pain which has been ongoing for few months now.  ECOG PS- 0 Pain scale- 3   Review of systems- Review of Systems  Constitutional:  Negative for chills, fever, malaise/fatigue and weight loss.  HENT:  Negative for congestion, ear discharge and nosebleeds.   Eyes:  Negative for blurred vision.  Respiratory:  Negative for cough, hemoptysis, sputum production, shortness of breath and wheezing.   Cardiovascular:  Negative for chest pain, palpitations, orthopnea and claudication.  Gastrointestinal:  Positive for diarrhea. Negative for abdominal pain, blood in stool, constipation, heartburn, melena, nausea and vomiting.  Genitourinary:  Negative for dysuria, flank pain, frequency, hematuria and urgency.  Musculoskeletal:  Positive for joint pain. Negative for back pain and myalgias.  Skin:  Negative for rash.  Neurological:  Negative for dizziness, tingling, focal weakness, seizures, weakness and headaches.  Endo/Heme/Allergies:  Does not bruise/bleed easily.  Psychiatric/Behavioral:  Negative for depression and suicidal ideas. The patient does not have insomnia.       No Known Allergies   Past Medical History:  Diagnosis Date   Allergy     Depression    Eczematous dermatitis of eyelid    History of chicken pox    Hyperlipidemia    IBS (irritable bowel syndrome)    Osteopenia      Past Surgical History:  Procedure Laterality Date   BREAST BIOPSY Left 06/18/2015   BENIGN BREAST TISSUE WITH STROMAL FIBROSIS AND FIBROCYSTIC CHANGE.    BREAST BIOPSY Right 11/30/2018   affirm bx   ribbon marker   neg   BREAST BIOPSY Left 06/05/2023   us  bx 2:00 9 cmfn, heart marker, path pending   BREAST BIOPSY Left 06/05/2023   US  LT BREAST BX W LOC DEV 1ST LESION IMG BX SPEC US  GUIDE 06/05/2023 [facility]   BREAST EXCISIONAL BIOPSY Right 1989   COLONOSCOPY WITH  PROPOFOL  N/A 09/18/2015   Procedure: COLONOSCOPY WITH PROPOFOL ;  Surgeon: Gladis RAYMOND Mariner, MD;  Location: [facility]COPY;  Service: Endoscopy;  Laterality: N/A;   COLONOSCOPY WITH PROPOFOL  N/A 10/02/2021   Procedure: COLONOSCOPY WITH PROPOFOL ;  Surgeon: Toledo, Ladell POUR, MD;  Location: [facility] ENDOSCOPY;  Service: Gastroenterology;  Laterality: N/A;   ESOPHAGOGASTRODUODENOSCOPY (EGD) WITH PROPOFOL  N/A 10/02/2021   Procedure: ESOPHAGOGASTRODUODENOSCOPY (EGD) WITH PROPOFOL ;  Surgeon: Toledo, Ladell POUR, MD;  Location: [facility] ENDOSCOPY;  Service: Gastroenterology;  Laterality: N/A;   TONSILLECTOMY     TUBAL LIGATION      Social History   Socioeconomic History   Marital status: Married    Spouse name: Not on file   Number of children: Not on file   Years of education: Not on file   Highest education level: Not on file  Occupational History   Not on file  Tobacco Use   Smoking status: Former    Current packs/day: 0.00    Average packs/day: 1 pack/day for 43.0 years (43.0 ttl pk-yrs)  Types: Cigarettes    Start date: 05/05/1971    Quit date: 05/04/2014    Years since quitting: 9.7   Smokeless tobacco: Never  Vaping Use   Vaping status: Never Used  Substance and Sexual Activity   Alcohol use: Not Currently    Comment: socially   Drug use: No   Sexual activity: Not on file  Other Topics Concern   Not on file  Social History Narrative   Lives with [name], husband. No indoor pets.   Social Drivers of Corporate investment banker Strain: Low Risk  (12/16/2023)   Received from [facility]   Overall Financial Resource Strain (CARDIA)    Difficulty of Paying Living Expenses: Not hard at all  Food Insecurity: No Food Insecurity (12/16/2023)   Received from [facility]   Hunger Vital Sign    Within the past 12 months, you worried that your food would run out before you got the money to buy more.: Never true    Within the past 12 months, the food you  bought just didn't last and you didn't have money to get more.: Never true  Transportation Needs: No Transportation Needs (12/16/2023)   Received from [facility] - Transportation    In the past 12 months, has lack of transportation kept you from medical appointments or from getting medications?: No    Lack of Transportation (Non-Medical): No  Physical Activity: Not on file  Stress: Not on file  Social Connections: Not on file  Intimate Partner Violence: Not At Risk (06/16/2023)   Humiliation, Afraid, Rape, and Kick questionnaire    Fear of Current or Ex-Partner: No    Emotionally Abused: No    Physically Abused: No    Sexually Abused: No    Family History  Problem Relation Age of Onset   Breast cancer Mother    Hyperlipidemia Mother    Cancer Mother 68       breast   Mental illness Mother        Alzheimers Dementia   Hyperlipidemia Father    Hypertension Father      Current Outpatient Medications:    Ascorbic Acid (VITAMIN C) 1000 MG tablet, Take 1,000 mg by mouth daily., Disp: , Rfl:    aspirin 81 MG EC tablet, Take 325 mg by mouth daily.  , Disp: , Rfl:    atorvastatin (LIPITOR) 40 MG tablet, Take 1 tablet by mouth at bedtime., Disp: , Rfl:    Calcium Citrate-Vitamin D  (CITRACAL + D PO), Take by mouth daily at 8 pm., Disp: , Rfl:    cetirizine (ZYRTEC) 10 MG tablet, Take 10 mg by mouth daily.  , Disp: , Rfl:    chlorthalidone (HYGROTON) 25 MG tablet, Take 1 tablet by mouth daily., Disp: , Rfl:    cholecalciferol (VITAMIN D ) 1000 UNITS tablet, Take 1,000 Units by mouth daily.  , Disp: , Rfl:    cyanocobalamin (VITAMIN B12) 1000 MCG tablet, Take 1,000 mcg by mouth daily., Disp: , Rfl:    escitalopram  (LEXAPRO ) 5 MG tablet, Take 1 tablet (5 mg total) by mouth at bedtime. (Patient taking differently: Take 10 mg by mouth at bedtime.), Disp: 30 tablet, Rfl: 0   ferrous sulfate 324 (65 Fe) MG TBEC, Take by mouth., Disp: , Rfl:    fish oil-omega-3 fatty  acids 1000 MG capsule, Take 2 g by mouth daily.  , Disp: , Rfl:    fluticasone  (FLONASE ) 50  MCG/ACT nasal spray, USE 2 SPRAY IN EACH NOSTRIL EVERY DAY, Disp: 16 g, Rfl: 0   MULTIPLE VITAMIN PO, Take by mouth.  , Disp: , Rfl:    Multiple Vitamins-Minerals (MULTIVITAMIN ADULTS) TABS, Take by mouth., Disp: , Rfl:    traZODone (DESYREL) 50 MG tablet, Take by mouth., Disp: , Rfl:    vitamin E 400 UNIT capsule, Take 400 Units by mouth daily.  , Disp: , Rfl:    magic mouthwash (multi-ingredient) oral suspension, Swish and swallow 5-10 mLs 4 (four) times daily., Disp: 480 mL, Rfl: 3   magic mouthwash (nystatin , lidocaine , diphenhydrAMINE ) suspension, Take 5 mLs by mouth., Disp: , Rfl:   Physical exam:  Vitals:   02/10/24 1038  BP: (!) 107/93  Pulse: 75  Resp: 18  Temp: (!) 97.1 F (36.2 C)  TempSrc: Tympanic  SpO2: 99%  Weight: 127 lb 4.8 oz (57.7 kg)  Height: 5' 1 (1.549 m)   Physical Exam Cardiovascular:     Rate and Rhythm: Normal rate and regular rhythm.     Heart sounds: Normal heart sounds.  Pulmonary:     Effort: Pulmonary effort is normal.     Breath sounds: Normal breath sounds.  Abdominal:     General: Bowel sounds are normal.     Palpations: Abdomen is soft.     Comments: No palpable hepatosplenomegaly  Lymphadenopathy:     Comments: No palpable cervical, supraclavicular, axillary or inguinal adenopathy    Skin:    General: Skin is warm and dry.  Neurological:     Mental Status: She is alert and oriented to person, place, and time.      I have personally reviewed labs listed below:    Latest Ref Rng & Units 02/10/2024   10:04 AM  CMP  Glucose 70 - 99 mg/dL 870   BUN 8 - 23 mg/dL 13   Creatinine 9.55 - 1.00 mg/dL 9.16   Sodium 864 - 854 mmol/L 132   Potassium 3.5 - 5.1 mmol/L 3.6   Chloride 98 - 111 mmol/L 99   CO2 22 - 32 mmol/L 26   Calcium 8.9 - 10.3 mg/dL 8.9   Total Protein 6.5 - 8.1 g/dL 6.9   Total Bilirubin 0.0 - 1.2 mg/dL 0.6   Alkaline Phos 38 -  126 U/L 74   AST 15 - 41 U/L 34   ALT 0 - 44 U/L 27       Latest Ref Rng & Units 02/10/2024   10:05 AM  CBC  WBC 4.0 - 10.5 K/uL 7.0   Hemoglobin 12.0 - 15.0 g/dL 85.2   Hematocrit 63.9 - 46.0 % 43.6   Platelets 150 - 400 K/uL 265      Assessment and plan- Patient is a 73 y.o. female with history of low-grade follicular lymphoma stage IV s/p 4 cycles of Rituxan .  She is here for a routine surveillance visit for follicular lymphoma  Clinically patient is doing well with no concerning signs and symptoms of recurrent Based on today's exam.  I will plan to get repeat PET CT scan towards the end of October.  Plan to do scans every 6 months up to 2 years for surveillance.  I will tentatively see her back in 4 months with CBC with differential CMP and LDH.  Patient also had CT lung cancer screening protocol which she will continue.    With regards to irregular bowel movements I will refer her to [facility]   Visit Diagnosis 1.  Grade I follicular lymphoma of extranodal site excluding spleen and other solid organs [facility])      Dr. Annah Skene, MD, MPH [facility] [phone] 02/10/2024 1:41 PM

## 2024-02-11 ENCOUNTER — Other Ambulatory Visit: Payer: Self-pay

## 2024-02-29 DIAGNOSIS — E7800 Pure hypercholesterolemia, unspecified: Secondary | ICD-10-CM | POA: Diagnosis not present

## 2024-03-03 ENCOUNTER — Other Ambulatory Visit: Payer: Self-pay

## 2024-03-07 DIAGNOSIS — I10 Essential (primary) hypertension: Secondary | ICD-10-CM | POA: Diagnosis not present

## 2024-03-07 DIAGNOSIS — Z0000 Encounter for general adult medical examination without abnormal findings: Secondary | ICD-10-CM | POA: Diagnosis not present

## 2024-03-07 DIAGNOSIS — D508 Other iron deficiency anemias: Secondary | ICD-10-CM | POA: Diagnosis not present

## 2024-03-07 DIAGNOSIS — Z1331 Encounter for screening for depression: Secondary | ICD-10-CM | POA: Diagnosis not present

## 2024-03-07 DIAGNOSIS — E7800 Pure hypercholesterolemia, unspecified: Secondary | ICD-10-CM | POA: Diagnosis not present

## 2024-03-11 ENCOUNTER — Other Ambulatory Visit: Payer: Self-pay | Admitting: Acute Care

## 2024-03-11 DIAGNOSIS — Z87891 Personal history of nicotine dependence: Secondary | ICD-10-CM

## 2024-03-11 DIAGNOSIS — B351 Tinea unguium: Secondary | ICD-10-CM | POA: Diagnosis not present

## 2024-03-11 DIAGNOSIS — Z122 Encounter for screening for malignant neoplasm of respiratory organs: Secondary | ICD-10-CM

## 2024-03-15 ENCOUNTER — Encounter
Admission: RE | Admit: 2024-03-15 | Discharge: 2024-03-15 | Disposition: A | Discharge status: Home / Self Care | Source: Ambulatory Visit | Attending: Oncology | Admitting: Oncology

## 2024-03-15 DIAGNOSIS — C8202 Follicular lymphoma grade I, intrathoracic lymph nodes: Secondary | ICD-10-CM | POA: Diagnosis not present

## 2024-03-15 DIAGNOSIS — C8209 Follicular lymphoma grade I, extranodal and solid organ sites: Secondary | ICD-10-CM | POA: Diagnosis not present

## 2024-03-15 LAB — GLUCOSE, CAPILLARY: Glucose-Capillary: 95 mg/dL (ref 70–99)

## 2024-03-15 MED ORDER — FLUDEOXYGLUCOSE F - 18 (FDG) INJECTION
6.6000 | Freq: Once | INTRAVENOUS | Status: AC | PRN
  Administered 2024-03-15: 7.07 via INTRAVENOUS

## 2024-03-16 ENCOUNTER — Ambulatory Visit

## 2024-03-25 ENCOUNTER — Ambulatory Visit
Admission: RE | Admit: 2024-03-25 | Discharge: 2024-03-25 | Disposition: A | Discharge status: Home / Self Care | Source: Ambulatory Visit | Attending: Acute Care | Admitting: Acute Care

## 2024-03-25 DIAGNOSIS — Z122 Encounter for screening for malignant neoplasm of respiratory organs: Secondary | ICD-10-CM | POA: Diagnosis not present

## 2024-03-25 DIAGNOSIS — Z87891 Personal history of nicotine dependence: Secondary | ICD-10-CM | POA: Insufficient documentation

## 2024-03-31 ENCOUNTER — Telehealth: Payer: Self-pay

## 2024-03-31 ENCOUNTER — Telehealth: Payer: Self-pay | Admitting: Acute Care

## 2024-03-31 NOTE — Telephone Encounter (Signed)
 See provider note and encounter notes from RN. Pt will f/u in [place] and complete 3 month follow up.

## 2024-03-31 NOTE — Telephone Encounter (Signed)
 See additional encounters, patient has been reached and results reviewed.

## 2024-03-31 NOTE — Telephone Encounter (Signed)
 3 month follow up is ok. Due after 06/25/2024 Please call patient and let her know she has a nodule that is slowly growing. We are going to do a 3 month follow up, and she is going to go and see Dr. KANDICE. Before hand to get established . Please fax results to PCP and let them know plan of care. Thanks so much

## 2024-03-31 NOTE — Telephone Encounter (Signed)
 Ladies, looks like the [employer] who will be managing this patient want a 3 month follow up scan, but they want to see her in th office to establish with them now. Can we order the follow up scan and get her scheduled with Dr. Tamea  within the next few weeks. Dr. KANDICE will send Eleanor a message to get her scheduled with [place].

## 2024-03-31 NOTE — Telephone Encounter (Signed)
 Call Report from Tiffany  IMPRESSION: Lung-RADS 4A, suspicious. Follow up low-dose chest CT without contrast in 3 months (please use the following order, CT CHEST LCS NODULE FOLLOW-UP W/O CM) is recommended. Alternatively, PET may be considered when there is a solid component 8mm or larger. Right upper lobe irregular nodule is more well defined, with suggestion of developing solid component.   Aortic atherosclerosis (ICD10-I70.0), coronary artery atherosclerosis and emphysema (ICD10-J43.9).

## 2024-03-31 NOTE — Telephone Encounter (Signed)
 Left VM for pt to call to discuss CT results.

## 2024-04-01 ENCOUNTER — Telehealth: Payer: Self-pay | Admitting: Acute Care

## 2024-04-04 ENCOUNTER — Other Ambulatory Visit: Payer: Self-pay

## 2024-04-04 DIAGNOSIS — R911 Solitary pulmonary nodule: Secondary | ICD-10-CM

## 2024-04-04 DIAGNOSIS — Z122 Encounter for screening for malignant neoplasm of respiratory organs: Secondary | ICD-10-CM

## 2024-04-04 DIAGNOSIS — Z87891 Personal history of nicotine dependence: Secondary | ICD-10-CM

## 2024-04-05 ENCOUNTER — Other Ambulatory Visit: Payer: Self-pay

## 2024-04-08 NOTE — Progress Notes (Signed)
 Nicole York                                          MRN: [id number]   04/08/2024   The [facility] Quality Team Specialist reviewed this patient medical record for the purposes of chart review for care gap closure. The following were reviewed: abstraction for care gap closure-controlling blood pressure.    [facility] Quality Team

## 2024-04-12 ENCOUNTER — Ambulatory Visit

## 2024-04-12 ENCOUNTER — Ambulatory Visit
Admission: RE | Admit: 2024-04-12 | Discharge: 2024-04-12 | Disposition: A | Discharge status: Home / Self Care | Source: Ambulatory Visit | Attending: Pulmonary Disease | Admitting: Pulmonary Disease

## 2024-04-12 ENCOUNTER — Telehealth: Payer: Self-pay

## 2024-04-12 ENCOUNTER — Encounter: Payer: Self-pay | Admitting: Pulmonary Disease

## 2024-04-12 ENCOUNTER — Ambulatory Visit: Admitting: Pulmonary Disease

## 2024-04-12 VITALS — BP 112/80 | HR 63 | Temp 97.7°F | Ht 61.0 in | Wt 126.0 lb

## 2024-04-12 DIAGNOSIS — Z87891 Personal history of nicotine dependence: Secondary | ICD-10-CM | POA: Diagnosis not present

## 2024-04-12 DIAGNOSIS — R911 Solitary pulmonary nodule: Secondary | ICD-10-CM | POA: Insufficient documentation

## 2024-04-12 DIAGNOSIS — C8200 Follicular lymphoma grade I, unspecified site: Secondary | ICD-10-CM

## 2024-04-12 DIAGNOSIS — R918 Other nonspecific abnormal finding of lung field: Secondary | ICD-10-CM | POA: Diagnosis not present

## 2024-04-12 DIAGNOSIS — I700 Atherosclerosis of aorta: Secondary | ICD-10-CM | POA: Diagnosis not present

## 2024-04-12 DIAGNOSIS — J439 Emphysema, unspecified: Secondary | ICD-10-CM | POA: Diagnosis not present

## 2024-04-12 NOTE — H&P (View-Only) (Signed)
 Subjective:    Patient ID: Nicole York, female    DOB: July 10, 1950, 73 y.o.   MRN: [id number]  Patient Care Team: Alla Amis, MD as PCP - General (Family Medicine) Alla Amis, MD as Referring Physician [facility])  Chief Complaint  Patient presents with   Consult    Here to go over CT scan from 03/25/2024    BACKGROUND: Patient is a 73 year old former smoker (110 PY) with a history as noted below, who presents for evaluation of a lung nodule noted on lung cancer screening CT.  She is referred by the lung cancer screening program.  Her primary physician is Dr. Alla, her oncologist is Dr. Annah Skene.   HPI Discussed the use of AI scribe software for clinical note transcription with the patient, who gave verbal consent to proceed.  History of Present Illness   Nicole York is a 73 year old female with recently diagnosed follicular lymphoma who presents for evaluation of a lung nodule noted on lung cancer screening CT.  She is accompanied by her husband, [name].  She was recently diagnosed with follicular lymphoma, a type of non-Hodgkin's lymphoma, and is currently under observation for this condition.   A lung nodule was identified on a recent lung cancer screening CT. The nodule was also present on a CT from 23-Nov-2024but was less pronounced at that time.  For reference, the nodule was purely ground glass measuring 5.9 mm in October 2022, showed no significant growth on 10 March 2022 exam and it measured 7.19 mm on the 23-Nov-2024exam.  The nodule has shown growth since the previous scan now measuring 11.2 mm. A PET scan conducted in October did not show any activity in the nodule however, slow-growing tumors may not show activity on PET/CT.  She has experienced recent illnesses, including an intestinal virus and a throat virus with coughing, which have occurred twice. She has been feeling generally unwell, which is unusual for her as she  has always been healthy prior to this year.  She has a family history of cancer among her father's siblings and cousins, although her immediate family does not have a history of cancer.  She has quit smoking, which is a positive change in her lifestyle.  She does not endorse any other symptomatology today.  I reviewed her CT scan from 25 March 2024.  This was also reviewed with the patient and her husband and all questions were answered to their satisfaction.  Patient is anxious to get clarification on the nature of this nodule and would like to proceed with biopsy.  Review of Systems A 10 point review of systems was performed and it is as noted above otherwise negative.   Past Medical History:  Diagnosis Date   Allergy     Depression    Eczematous dermatitis of eyelid    History of chicken pox    Hyperlipidemia    IBS (irritable bowel syndrome)    Osteopenia     Past Surgical History:  Procedure Laterality Date   BREAST BIOPSY Left 06/18/2015   BENIGN BREAST TISSUE WITH STROMAL FIBROSIS AND FIBROCYSTIC CHANGE.    BREAST BIOPSY Right 11/30/2018   affirm bx   ribbon marker   neg   BREAST BIOPSY Left 06/05/2023   us  bx 2:00 9 cmfn, heart marker, path pending   BREAST BIOPSY Left 06/05/2023   US  LT BREAST BX W LOC DEV 1ST LESION IMG BX SPEC US  GUIDE 06/05/2023 [facility]  BREAST EXCISIONAL BIOPSY Right 1989   COLONOSCOPY WITH PROPOFOL  N/A 09/18/2015   Procedure: COLONOSCOPY WITH PROPOFOL ;  Surgeon: Gladis RAYMOND Mariner, MD;  [facility] ENDOSCOPY;  Service: Endoscopy;  Laterality: N/A;   COLONOSCOPY WITH PROPOFOL  N/A 10/02/2021   Procedure: COLONOSCOPY WITH PROPOFOL ;  Surgeon: Toledo, Ladell POUR, MD;  Location: [facility] ENDOSCOPY;  Service: Gastroenterology;  Laterality: N/A;   ESOPHAGOGASTRODUODENOSCOPY (EGD) WITH PROPOFOL  N/A 10/02/2021   Procedure: ESOPHAGOGASTRODUODENOSCOPY (EGD) WITH PROPOFOL ;  Surgeon: Toledo, Ladell POUR, MD;  Location: [facility] ENDOSCOPY;  Service:  Gastroenterology;  Laterality: N/A;   TONSILLECTOMY     TUBAL LIGATION      Patient Active Problem List   Diagnosis Date Noted   Follicular lymphoma grade I (HCC) 07/21/2023   Eczematous dermatitis of eyelid 05/02/2016   Abnormal mammogram of left breast 05/30/2015   Insomnia secondary to anxiety 09/17/2013   Visit for preventive health examination 09/15/2013   Hyperlipidemia LDL goal <100 06/02/2012   Encounter for long-term (current) use of other medications 06/02/2012   Routine general medical examination at a health care facility 06/02/2012   Tobacco abuse 05/08/2011   Tobacco abuse counseling 05/08/2011    Family History  Problem Relation Age of Onset   Breast cancer Mother    Hyperlipidemia Mother    Cancer Mother 61       breast   Mental illness Mother        Alzheimers Dementia   Hyperlipidemia Father    Hypertension Father     Social History   Tobacco Use   Smoking status: Former    Current packs/day: 0.00    Average packs/day: 1 pack/day for 43.0 years (43.0 ttl pk-yrs)    Types: Cigarettes    Start date: 05/05/1971    Quit date: 05/04/2014    Years since quitting: 9.9   Smokeless tobacco: Never  Substance Use Topics   Alcohol use: Not Currently    Comment: socially    No Known Allergies  Current Meds  Medication Sig   Ascorbic Acid (VITAMIN C) 1000 MG tablet Take 1,000 mg by mouth daily.   aspirin 81 MG EC tablet Take 325 mg by mouth daily.     atorvastatin (LIPITOR) 40 MG tablet Take 1 tablet by mouth at bedtime.   Calcium Citrate-Vitamin D  (CITRACAL + D PO) Take by mouth daily at 8 pm.   cetirizine (ZYRTEC) 10 MG tablet Take 10 mg by mouth daily.     chlorthalidone (HYGROTON) 25 MG tablet Take 1 tablet by mouth daily.   cholecalciferol (VITAMIN D ) 1000 UNITS tablet Take 1,000 Units by mouth daily.     cyanocobalamin (VITAMIN B12) 1000 MCG tablet Take 1,000 mcg by mouth daily.   escitalopram  (LEXAPRO ) 5 MG tablet Take 1 tablet (5 mg total) by  mouth at bedtime. (Patient taking differently: Take 10 mg by mouth at bedtime.)   ferrous sulfate 324 (65 Fe) MG TBEC Take by mouth.   fish oil-omega-3 fatty acids 1000 MG capsule Take 2 g by mouth daily.     fluticasone  (FLONASE ) 50 MCG/ACT nasal spray USE 2 SPRAY IN EACH NOSTRIL EVERY DAY   MULTIPLE VITAMIN PO Take by mouth.     Multiple Vitamins-Minerals (MULTIVITAMIN ADULTS) TABS Take by mouth.   traZODone (DESYREL) 50 MG tablet Take by mouth.   vitamin E 400 UNIT capsule Take 400 Units by mouth daily.      Immunization History  Administered Date(s) Administered   Influenza Split 02/06/2013, 02/06/2014   Influenza-Unspecified 01/18/2012  Moderna Covid-19 Fall Seasonal Vaccine 57yrs & older 03/25/2022   Moderna Sars-Covid-2 Vaccination 06/16/2019, 07/13/2019, 03/11/2020, 09/11/2020   Pfizer(Comirnaty)Fall Seasonal Vaccine 12 years and older 02/29/2024   Pneumococcal Conjugate-13 05/08/2014   Td 06/02/2010   Zoster, Live 09/16/2011        Objective:     Vitals:   04/12/24 0928  BP: 112/80  Pulse: 63  Temp: 97.7 F (36.5 C)  Height: 5' 1 (1.549 m)  Weight: 126 lb (57.2 kg)  SpO2: 96%  TempSrc: Temporal  BMI (Calculated): 23.82     SpO2: 96 %  GENERAL: Well-developed, well-nourished woman, no acute distress.  Looks younger than her stated age.  No conversational dyspnea. HEAD: Normocephalic, atraumatic.  EYES: Pupils equal, round, reactive to light.  No scleral icterus.  MOUTH: Dentition intact, oral mucosa moist.  No thrush. NECK: Supple. No thyromegaly. Trachea midline. No JVD.  No adenopathy. PULMONARY: Good air entry bilaterally.  No adventitious sounds. CARDIOVASCULAR: S1 and S2. Regular rate and rhythm.  No rubs, murmurs or gallops heard. ABDOMEN: Benign. MUSCULOSKELETAL: No joint deformity, no clubbing, no edema.  NEUROLOGIC: No overt focal deficit, no gait disturbance, speech is fluent. SKIN: Intact,warm,dry. PSYCH: Mood and behavior  normal.    Representative image from CT performed 25 March 2024 with nodule in question noted (arrows):       Assessment & Plan:     ICD-10-CM   1. Lung nodule seen on imaging study  R91.1 CT SUPER D CHEST WO MONARCH PILOT    2. Follicular lymphoma grade I, unspecified body region (HCC)  C82.00     3. Former smoker  Z87.891       Orders Placed This Encounter  Procedures   CT SUPER D CHEST WO MONARCH PILOT    Standing Status:   Future    Number of Occurrences:   1    Expiration Date:   04/12/2025    Scheduling Instructions:     Please do before 1 December. Pt has robotic bronch on 1 December. Pt prefers 25 or 26 Nov for test    Preferred imaging location?:   [facility]   Discussion:    Right upper lung nodule, growing, suspicious for malignancy A growing nodule in the right upper lung, suspicious for malignancy, was identified on lung cancer screening CT. The nodule has increased in size since the last CT in November 2024. A recent PET scan in October did not show activity, which is common for slow-growing tumors. Differential diagnosis includes slow-growing cancer, possibly related to follicular lymphoma, or a separate entity such as adenocarcinoma. The nodule's slow growth and lack of PET activity suggest a low likelihood of aggressive malignancy, but biopsy is necessary for definitive diagnosis. - Scheduled biopsy of the right upper lung nodule using robotic assistance under general anesthesia. - Coordinate with oncologist Dr. Melanee for potential treatment options post-biopsy, including resection or radiation therapy. - Scheduled a CT scan to map bronchial tubes for biopsy guidance. - Plan for potential overnight hospital stay post-biopsy if complications such as lung collapse occur. - Discussed potential risks of biopsy, including lung collapse and need for chest tube, with a likelihood of less than 3%. - Plan for follow-up with biopsy results to determine further  management.     Advised if symptoms do not improve or worsen, to please contact office for sooner follow up or seek emergency care.    I spent 45 minutes of dedicated to the care of this patient on the date  of this encounter to include pre-visit review of records, face-to-face time with the patient discussing conditions above, post visit ordering of testing, clinical documentation with the electronic health record, making appropriate referrals as documented, and communicating necessary findings to members of the patients care team.   C. Leita Sanders, MD Advanced Bronchoscopy PCCM [facility]    *This note was dictated using voice recognition software/Dragon.  Despite best efforts to proofread, errors can occur which can change the meaning. Any transcriptional errors that result from this process are unintentional and may not be fully corrected at the time of dictation.

## 2024-04-12 NOTE — Progress Notes (Addendum)
 Subjective:    Patient ID: Nicole York, female    DOB: July 10, 1950, 73 y.o.   MRN: [id number]  Patient Care Team: Alla Amis, MD as PCP - General (Family Medicine) Alla Amis, MD as Referring Physician [facility])  Chief Complaint  Patient presents with   Consult    Here to go over CT scan from 03/25/2024    BACKGROUND: Patient is a 73 year old former smoker (110 PY) with a history as noted below, who presents for evaluation of a lung nodule noted on lung cancer screening CT.  She is referred by the lung cancer screening program.  Her primary physician is Dr. Alla, her oncologist is Dr. Annah Skene.   HPI Discussed the use of AI scribe software for clinical note transcription with the patient, who gave verbal consent to proceed.  History of Present Illness   Nicole York is a 73 year old female with recently diagnosed follicular lymphoma who presents for evaluation of a lung nodule noted on lung cancer screening CT.  She is accompanied by her husband, [name].  She was recently diagnosed with follicular lymphoma, a type of non-Hodgkin's lymphoma, and is currently under observation for this condition.   A lung nodule was identified on a recent lung cancer screening CT. The nodule was also present on a CT from 23-Nov-2024but was less pronounced at that time.  For reference, the nodule was purely ground glass measuring 5.9 mm in October 2022, showed no significant growth on 10 March 2022 exam and it measured 7.19 mm on the 23-Nov-2024exam.  The nodule has shown growth since the previous scan now measuring 11.2 mm. A PET scan conducted in October did not show any activity in the nodule however, slow-growing tumors may not show activity on PET/CT.  She has experienced recent illnesses, including an intestinal virus and a throat virus with coughing, which have occurred twice. She has been feeling generally unwell, which is unusual for her as she  has always been healthy prior to this year.  She has a family history of cancer among her father's siblings and cousins, although her immediate family does not have a history of cancer.  She has quit smoking, which is a positive change in her lifestyle.  She does not endorse any other symptomatology today.  I reviewed her CT scan from 25 March 2024.  This was also reviewed with the patient and her husband and all questions were answered to their satisfaction.  Patient is anxious to get clarification on the nature of this nodule and would like to proceed with biopsy.  Review of Systems A 10 point review of systems was performed and it is as noted above otherwise negative.   Past Medical History:  Diagnosis Date   Allergy     Depression    Eczematous dermatitis of eyelid    History of chicken pox    Hyperlipidemia    IBS (irritable bowel syndrome)    Osteopenia     Past Surgical History:  Procedure Laterality Date   BREAST BIOPSY Left 06/18/2015   BENIGN BREAST TISSUE WITH STROMAL FIBROSIS AND FIBROCYSTIC CHANGE.    BREAST BIOPSY Right 11/30/2018   affirm bx   ribbon marker   neg   BREAST BIOPSY Left 06/05/2023   us  bx 2:00 9 cmfn, heart marker, path pending   BREAST BIOPSY Left 06/05/2023   US  LT BREAST BX W LOC DEV 1ST LESION IMG BX SPEC US  GUIDE 06/05/2023 [facility]  BREAST EXCISIONAL BIOPSY Right 1989   COLONOSCOPY WITH PROPOFOL  N/A 09/18/2015   Procedure: COLONOSCOPY WITH PROPOFOL ;  Surgeon: Gladis RAYMOND Mariner, MD;  Location[facility] ENDOSCOPY;  Service: Endoscopy;  Laterality: N/A;   COLONOSCOPY WITH PROPOFOL  N/A 10/02/2021   Procedure: COLONOSCOPY WITH PROPOFOL ;  Surgeon: Toledo, Ladell POUR, MD;  Location: [facility] ENDOSCOPY;  Service: Gastroenterology;  Laterality: N/A;   ESOPHAGOGASTRODUODENOSCOPY (EGD) WITH PROPOFOL  N/A 10/02/2021   Procedure: ESOPHAGOGASTRODUODENOSCOPY (EGD) WITH PROPOFOL ;  Surgeon: Toledo, Ladell POUR, MD;  Location: [facility] ENDOSCOPY;  Service:  Gastroenterology;  Laterality: N/A;   TONSILLECTOMY     TUBAL LIGATION      Patient Active Problem List   Diagnosis Date Noted   Follicular lymphoma grade I (HCC) 07/21/2023   Eczematous dermatitis of eyelid 05/02/2016   Abnormal mammogram of left breast 05/30/2015   Insomnia secondary to anxiety 09/17/2013   Visit for preventive health examination 09/15/2013   Hyperlipidemia LDL goal <100 06/02/2012   Encounter for long-term (current) use of other medications 06/02/2012   Routine general medical examination at a health care facility 06/02/2012   Tobacco abuse 05/08/2011   Tobacco abuse counseling 05/08/2011    Family History  Problem Relation Age of Onset   Breast cancer Mother    Hyperlipidemia Mother    Cancer Mother 39       breast   Mental illness Mother        Alzheimers Dementia   Hyperlipidemia Father    Hypertension Father     Social History   Tobacco Use   Smoking status: Former    Current packs/day: 0.00    Average packs/day: 1 pack/day for 43.0 years (43.0 ttl pk-yrs)    Types: Cigarettes    Start date: 05/05/1971    Quit date: 05/04/2014    Years since quitting: 9.9   Smokeless tobacco: Never  Substance Use Topics   Alcohol use: Not Currently    Comment: socially    No Known Allergies  Current Meds  Medication Sig   Ascorbic Acid (VITAMIN C) 1000 MG tablet Take 1,000 mg by mouth daily.   aspirin 81 MG EC tablet Take 325 mg by mouth daily.     atorvastatin (LIPITOR) 40 MG tablet Take 1 tablet by mouth at bedtime.   Calcium Citrate-Vitamin D  (CITRACAL + D PO) Take by mouth daily at 8 pm.   cetirizine (ZYRTEC) 10 MG tablet Take 10 mg by mouth daily.     chlorthalidone (HYGROTON) 25 MG tablet Take 1 tablet by mouth daily.   cholecalciferol (VITAMIN D ) 1000 UNITS tablet Take 1,000 Units by mouth daily.     cyanocobalamin (VITAMIN B12) 1000 MCG tablet Take 1,000 mcg by mouth daily.   escitalopram  (LEXAPRO ) 5 MG tablet Take 1 tablet (5 mg total) by  mouth at bedtime. (Patient taking differently: Take 10 mg by mouth at bedtime.)   ferrous sulfate 324 (65 Fe) MG TBEC Take by mouth.   fish oil-omega-3 fatty acids 1000 MG capsule Take 2 g by mouth daily.     fluticasone  (FLONASE ) 50 MCG/ACT nasal spray USE 2 SPRAY IN EACH NOSTRIL EVERY DAY   MULTIPLE VITAMIN PO Take by mouth.     Multiple Vitamins-Minerals (MULTIVITAMIN ADULTS) TABS Take by mouth.   traZODone (DESYREL) 50 MG tablet Take by mouth.   vitamin E 400 UNIT capsule Take 400 Units by mouth daily.      Immunization History  Administered Date(s) Administered   Influenza Split 02/06/2013, 02/06/2014   Influenza-Unspecified 01/18/2012  Moderna Covid-19 Fall Seasonal Vaccine 57yrs & older 03/25/2022   Moderna Sars-Covid-2 Vaccination 06/16/2019, 07/13/2019, 03/11/2020, 09/11/2020   Pfizer(Comirnaty)Fall Seasonal Vaccine 12 years and older 02/29/2024   Pneumococcal Conjugate-13 05/08/2014   Td 06/02/2010   Zoster, Live 09/16/2011        Objective:     Vitals:   04/12/24 0928  BP: 112/80  Pulse: 63  Temp: 97.7 F (36.5 C)  Height: 5' 1 (1.549 m)  Weight: 126 lb (57.2 kg)  SpO2: 96%  TempSrc: Temporal  BMI (Calculated): 23.82     SpO2: 96 %  GENERAL: Well-developed, well-nourished woman, no acute distress.  Looks younger than her stated age.  No conversational dyspnea. HEAD: Normocephalic, atraumatic.  EYES: Pupils equal, round, reactive to light.  No scleral icterus.  MOUTH: Dentition intact, oral mucosa moist.  No thrush. NECK: Supple. No thyromegaly. Trachea midline. No JVD.  No adenopathy. PULMONARY: Good air entry bilaterally.  No adventitious sounds. CARDIOVASCULAR: S1 and S2. Regular rate and rhythm.  No rubs, murmurs or gallops heard. ABDOMEN: Benign. MUSCULOSKELETAL: No joint deformity, no clubbing, no edema.  NEUROLOGIC: No overt focal deficit, no gait disturbance, speech is fluent. SKIN: Intact,warm,dry. PSYCH: Mood and behavior  normal.    Representative image from CT performed 25 March 2024 with nodule in question noted (arrows):       Assessment & Plan:     ICD-10-CM   1. Lung nodule seen on imaging study  R91.1 CT SUPER D CHEST WO MONARCH PILOT    2. Follicular lymphoma grade I, unspecified body region (HCC)  C82.00     3. Former smoker  Z87.891       Orders Placed This Encounter  Procedures   CT SUPER D CHEST WO MONARCH PILOT    Standing Status:   Future    Number of Occurrences:   1    Expiration Date:   04/12/2025    Scheduling Instructions:     Please do before 1 December. Pt has robotic bronch on 1 December. Pt prefers 25 or 26 Nov for test    Preferred imaging location?:   [facility]   Discussion:    Right upper lung nodule, growing, suspicious for malignancy A growing nodule in the right upper lung, suspicious for malignancy, was identified on lung cancer screening CT. The nodule has increased in size since the last CT in November 2024. A recent PET scan in October did not show activity, which is common for slow-growing tumors. Differential diagnosis includes slow-growing cancer, possibly related to follicular lymphoma, or a separate entity such as adenocarcinoma. The nodule's slow growth and lack of PET activity suggest a low likelihood of aggressive malignancy, but biopsy is necessary for definitive diagnosis. - Scheduled biopsy of the right upper lung nodule using robotic assistance under general anesthesia. - Coordinate with oncologist Dr. Melanee for potential treatment options post-biopsy, including resection or radiation therapy. - Scheduled a CT scan to map bronchial tubes for biopsy guidance. - Plan for potential overnight hospital stay post-biopsy if complications such as lung collapse occur. - Discussed potential risks of biopsy, including lung collapse and need for chest tube, with a likelihood of less than 3%. - Plan for follow-up with biopsy results to determine further  management.     Advised if symptoms do not improve or worsen, to please contact office for sooner follow up or seek emergency care.    I spent 45 minutes of dedicated to the care of this patient on the date  of this encounter to include pre-visit review of records, face-to-face time with the patient discussing conditions above, post visit ordering of testing, clinical documentation with the electronic health record, making appropriate referrals as documented, and communicating necessary findings to members of the patients care team.   C. Leita Sanders, MD Advanced Bronchoscopy PCCM [facility]    *This note was dictated using voice recognition software/Dragon.  Despite best efforts to proofread, errors can occur which can change the meaning. Any transcriptional errors that result from this process are unintentional and may not be fully corrected at the time of dictation.

## 2024-04-12 NOTE — Patient Instructions (Addendum)
 VISIT SUMMARY:  During your visit, we discussed the growing nodule in your right upper lung that was identified on a recent lung cancer screening CT. We reviewed your recent health history, including your diagnosis of follicular lymphoma and recent illnesses. We also discussed your family history of cancer and your positive lifestyle change of quitting smoking.  YOUR PLAN:  -RIGHT UPPER LUNG NODULE, GROWING, SUSPICIOUS FOR MALIGNANCY: A growing nodule in your right upper lung was identified on a recent lung cancer screening CT. This nodule has increased in size since your last CT in November 2024. Although a recent PET scan did not show activity, which is common for slow-growing tumors, a biopsy is necessary to determine if it is cancerous. We have scheduled a biopsy of the nodule using robotic assistance under general anesthesia. We have also coordinated with your oncologist, Dr. Melanee, to discuss potential treatment options after the biopsy, which may include surgery or radiation therapy. A CT scan will be done to map your bronchial tubes to guide the biopsy. You may need to stay in the hospital overnight after the biopsy if there are complications such as a lung collapse, although the risk of this happening is less than 3%. We will follow up with the biopsy results to determine the next steps in your treatment.  INSTRUCTIONS:  Please follow up with the scheduled biopsy and CT scan. We will review the biopsy results together to determine the next steps in your treatment. If you experience any complications or have any concerns before your next appointment, please contact our office immediately.    We discussed that the procedure would have to be done under general anesthesia. The anesthesia team will discuss his part of the process with you.  Complications from the procedure itself are usually minor. One potential complication would be collapse of the lung which can occur in 2-3% of the cases. If  this  happens we would have to put a small tube to relieve the collapse and you would have to spend the night in the hospital in that event.  Another possibility would be that of bleeding, this is usually taken care of during the procedure.  In the situations you may need to be observed overnight.  For the most part though, should be able to go home the same day.  Other possibilities could include that the procedure would be nondiagnostic meaning that no definitive diagnosis could be attained.  We strive to try to decrease these potential issues.

## 2024-04-12 NOTE — Telephone Encounter (Signed)
 Robotic Bronchoscopy 04/18/2024 8:00 am R91.1 CPT Code: 68372  Nicole York please see Bronch info.   Patient aware of time and date.

## 2024-04-13 ENCOUNTER — Encounter
Admission: RE | Admit: 2024-04-13 | Discharge: 2024-04-13 | Disposition: A | Discharge status: Home / Self Care | Source: Ambulatory Visit | Attending: Pulmonary Disease | Admitting: Pulmonary Disease

## 2024-04-13 DIAGNOSIS — Z79899 Other long term (current) drug therapy: Secondary | ICD-10-CM

## 2024-04-13 DIAGNOSIS — I10 Essential (primary) hypertension: Secondary | ICD-10-CM

## 2024-04-13 DIAGNOSIS — Z01812 Encounter for preprocedural laboratory examination: Secondary | ICD-10-CM

## 2024-04-13 DIAGNOSIS — R911 Solitary pulmonary nodule: Secondary | ICD-10-CM

## 2024-04-13 HISTORY — DX: Essential (primary) hypertension: I10

## 2024-04-13 HISTORY — DX: Follicular lymphoma, unspecified, unspecified site: C82.90

## 2024-04-13 HISTORY — DX: Solitary pulmonary nodule: R91.1

## 2024-04-13 HISTORY — DX: Atherosclerotic heart disease of native coronary artery without angina pectoris: I25.10

## 2024-04-13 NOTE — Patient Instructions (Signed)
 Your procedure is scheduled on:04-18-24 Monday Report to the Registration Desk on the 1st floor of the [facility].Then proceed to the 2nd floor Surgery desk To find out your arrival time, please call [phone] between 1PM - 3PM on:04-15-24 Friday If your arrival time is 6:00 am, do not arrive before that time as the [facility] entrance doors do not open until 6:00 am.  REMEMBER: Instructions that are not followed completely may result in serious medical risk, up to and including death; or upon the discretion of your surgeon and anesthesiologist your surgery may need to be rescheduled.  Do not eat food OR drink liquids after midnight the night before surgery.  No gum chewing or hard candies.  One week prior to surgery:Stop NOW (04-13-24) Stop Anti-inflammatories (NSAIDS) such as Advil, Aleve, Ibuprofen, Motrin, Naproxen, Naprosyn and Aspirin based products such as Excedrin, Goody's Powder, BC Powder. Stop ANY OVER THE COUNTER supplements until after surgery.  You may however, continue to take Tylenol  if needed for pain up until the day of surgery.  Continue taking all of your other prescription medications up until the day of surgery.  ON THE DAY OF SURGERY ONLY TAKE THESE MEDICATIONS WITH SIPS OF WATER : -escitalopram  (LEXAPRO )   Continue your 81 mg Aspirin up until the day prior to surgery-Do NOT take the day of surgery  No Alcohol for 24 hours before or after surgery.  No Smoking including e-cigarettes for 24 hours before surgery.  No chewable tobacco products for at least 6 hours before surgery.  No nicotine patches on the day of surgery.  Do not use any recreational drugs for at least a week (preferably 2 weeks) before your surgery.  Please be advised that the combination of cocaine and anesthesia may have negative outcomes, up to and including death. If you test positive for cocaine, your surgery will be cancelled.  On the morning of surgery brush your teeth with  toothpaste and water , you may rinse your mouth with mouthwash if you wish. Do not swallow any toothpaste or mouthwash.  Do not wear jewelry, make-up, hairpins, clips or nail polish.  For welded (permanent) jewelry: bracelets, anklets, waist bands, etc.  Please have this removed prior to surgery.  If it is not removed, there is a chance that hospital personnel will need to cut it off on the day of surgery.  Do not wear lotions, powders, or perfumes.   Do not shave body hair from the neck down 48 hours before surgery.  Contact lenses, hearing aids and dentures may not be worn into surgery.  Do not bring valuables to the hospital. [facility] is not responsible for any missing/lost belongings or valuables.   Notify your doctor if there is any change in your medical condition (cold, fever, infection).  Wear comfortable clothing (specific to your surgery type) to the hospital.  After surgery, you can help prevent lung complications by doing breathing exercises.  Take deep breaths and cough every 1-2 hours. Your doctor may order a device called an Incentive Spirometer to help you take deep breaths. When coughing or sneezing, hold a pillow firmly against your incision with both hands. This is called "splinting." Doing this helps protect your incision. It also decreases belly discomfort.  If you are being admitted to the hospital overnight, leave your suitcase in the car. After surgery it may be brought to your room.  In case of increased patient census, it may be necessary for you, the patient, to continue your postoperative  care in the Same Day Surgery department.  If you are being discharged the day of surgery, you will not be allowed to drive home. You will need a responsible individual to drive you home and stay with you for 24 hours after surgery.   If you are taking public transportation, you will need to have a responsible individual with you.  Please call the Pre-admissions Testing  Dept. at [phone] if you have any questions about these instructions.  Surgery Visitation Policy:  Patients having surgery or a procedure may have two visitors.  Children under the age of 37 must have an adult with them who is not the patient.   Merchandiser, Retail to address health-related social needs:  [email or web address]

## 2024-04-13 NOTE — Telephone Encounter (Signed)
 For the code [id number] Auth # [id number]  valid 04/12/24 to 07/11/2024

## 2024-04-13 NOTE — Telephone Encounter (Signed)
 Noted

## 2024-04-18 ENCOUNTER — Ambulatory Visit

## 2024-04-18 ENCOUNTER — Other Ambulatory Visit: Payer: Self-pay

## 2024-04-18 ENCOUNTER — Ambulatory Visit
Admission: RE | Admit: 2024-04-18 | Discharge: 2024-04-18 | Disposition: A | Discharge status: Home / Self Care | Attending: Pulmonary Disease | Admitting: Pulmonary Disease

## 2024-04-18 ENCOUNTER — Ambulatory Visit
Admission: RE | Disposition: A | Discharge status: Home / Self Care | Payer: Self-pay | Source: Home / Self Care | Attending: Pulmonary Disease

## 2024-04-18 ENCOUNTER — Ambulatory Visit: Admitting: Certified Registered Nurse Anesthetist

## 2024-04-18 ENCOUNTER — Encounter: Payer: Self-pay | Admitting: Pulmonary Disease

## 2024-04-18 ENCOUNTER — Ambulatory Visit: Payer: Self-pay | Admitting: Urgent Care

## 2024-04-18 DIAGNOSIS — R911 Solitary pulmonary nodule: Secondary | ICD-10-CM | POA: Diagnosis not present

## 2024-04-18 DIAGNOSIS — F419 Anxiety disorder, unspecified: Secondary | ICD-10-CM | POA: Diagnosis not present

## 2024-04-18 DIAGNOSIS — F32A Depression, unspecified: Secondary | ICD-10-CM | POA: Diagnosis not present

## 2024-04-18 DIAGNOSIS — Z87891 Personal history of nicotine dependence: Secondary | ICD-10-CM | POA: Diagnosis not present

## 2024-04-18 DIAGNOSIS — C8200 Follicular lymphoma grade I, unspecified site: Secondary | ICD-10-CM | POA: Diagnosis not present

## 2024-04-18 DIAGNOSIS — J189 Pneumonia, unspecified organism: Secondary | ICD-10-CM | POA: Diagnosis not present

## 2024-04-18 DIAGNOSIS — Z48813 Encounter for surgical aftercare following surgery on the respiratory system: Secondary | ICD-10-CM | POA: Diagnosis not present

## 2024-04-18 DIAGNOSIS — Z01812 Encounter for preprocedural laboratory examination: Secondary | ICD-10-CM

## 2024-04-18 DIAGNOSIS — R140 Abdominal distension (gaseous): Secondary | ICD-10-CM | POA: Diagnosis not present

## 2024-04-18 DIAGNOSIS — I2510 Atherosclerotic heart disease of native coronary artery without angina pectoris: Secondary | ICD-10-CM | POA: Diagnosis not present

## 2024-04-18 DIAGNOSIS — Z79899 Other long term (current) drug therapy: Secondary | ICD-10-CM

## 2024-04-18 DIAGNOSIS — R918 Other nonspecific abnormal finding of lung field: Secondary | ICD-10-CM | POA: Diagnosis not present

## 2024-04-18 DIAGNOSIS — I10 Essential (primary) hypertension: Secondary | ICD-10-CM | POA: Diagnosis not present

## 2024-04-18 LAB — BASIC METABOLIC PANEL WITH GFR
Anion gap: 10 (ref 5–15)
BUN: 11 mg/dL (ref 8–23)
CO2: 27 mmol/L (ref 22–32)
Calcium: 8.8 mg/dL — ABNORMAL LOW (ref 8.9–10.3)
Chloride: 101 mmol/L (ref 98–111)
Creatinine, Ser: 0.67 mg/dL (ref 0.44–1.00)
GFR, Estimated: >60 mL/min (ref >60)
Glucose, Bld: 111 mg/dL — ABNORMAL HIGH (ref 70–99)
Potassium: 3.3 mmol/L — ABNORMAL LOW (ref 3.5–5.1)
Sodium: 138 mmol/L (ref 135–145)

## 2024-04-18 SURGERY — VIDEO BRONCHOSCOPY WITH ENDOBRONCHIAL NAVIGATION
Anesthesia: General | Laterality: Right

## 2024-04-18 MED ORDER — ROCURONIUM BROMIDE 100 MG/10ML IV SOLN
INTRAVENOUS | Status: DC | PRN
  Administered 2024-04-18: 50 mg via INTRAVENOUS

## 2024-04-18 MED ORDER — PROPOFOL 10 MG/ML IV BOLUS
INTRAVENOUS | Status: DC | PRN
  Administered 2024-04-18: 150 ug/kg/min via INTRAVENOUS
  Administered 2024-04-18: 150 mg via INTRAVENOUS

## 2024-04-18 MED ORDER — CHLORHEXIDINE GLUCONATE 0.12 % MT SOLN
OROMUCOSAL | Status: AC
  Filled 2024-04-18: qty 15

## 2024-04-18 MED ORDER — SODIUM CHLORIDE 0.9 % IV SOLN: Freq: Once | INTRAVENOUS | Status: DC

## 2024-04-18 MED ORDER — ACETAMINOPHEN 10 MG/ML IV SOLN: 1000.0000 mg | Freq: Once | INTRAVENOUS | Status: DC | PRN

## 2024-04-18 MED ORDER — FENTANYL CITRATE (PF) 100 MCG/2ML IJ SOLN
INTRAMUSCULAR | Status: AC
  Filled 2024-04-18: qty 2

## 2024-04-18 MED ORDER — IPRATROPIUM-ALBUTEROL 0.5-2.5 (3) MG/3ML IN SOLN
3.0000 mL | Freq: Once | RESPIRATORY_TRACT | Status: AC
  Administered 2024-04-18: 3 mL via RESPIRATORY_TRACT

## 2024-04-18 MED ORDER — LIDOCAINE HCL (CARDIAC) PF 100 MG/5ML IV SOSY
PREFILLED_SYRINGE | INTRAVENOUS | Status: DC | PRN
  Administered 2024-04-18: 100 mg via INTRAVENOUS

## 2024-04-18 MED ORDER — ESMOLOL HCL 100 MG/10ML IV SOLN
INTRAVENOUS | Status: AC
  Filled 2024-04-18: qty 10

## 2024-04-18 MED ORDER — PROPOFOL 10 MG/ML IV BOLUS
INTRAVENOUS | Status: AC
  Filled 2024-04-18: qty 20

## 2024-04-18 MED ORDER — ONDANSETRON HCL 4 MG/2ML IJ SOLN
INTRAMUSCULAR | Status: DC | PRN
  Administered 2024-04-18: 4 mg via INTRAVENOUS

## 2024-04-18 MED ORDER — FENTANYL CITRATE (PF) 100 MCG/2ML IJ SOLN: 25.0000 ug | INTRAMUSCULAR | Status: DC | PRN

## 2024-04-18 MED ORDER — DEXAMETHASONE SOD PHOSPHATE PF 10 MG/ML IJ SOLN
INTRAMUSCULAR | Status: DC | PRN
  Administered 2024-04-18: 10 mg via INTRAVENOUS

## 2024-04-18 MED ORDER — ORAL CARE MOUTH RINSE: 15.0000 mL | Freq: Once | OROMUCOSAL | Status: AC

## 2024-04-18 MED ORDER — ESMOLOL HCL 100 MG/10ML IV SOLN
INTRAVENOUS | Status: DC | PRN
  Administered 2024-04-18 (×2): 10 mg via INTRAVENOUS

## 2024-04-18 MED ORDER — SUGAMMADEX SODIUM 200 MG/2ML IV SOLN
INTRAVENOUS | Status: DC | PRN
  Administered 2024-04-18: 200 mg via INTRAVENOUS

## 2024-04-18 MED ORDER — LIDOCAINE HCL (PF) 2 % IJ SOLN
INTRAMUSCULAR | Status: AC
  Filled 2024-04-18: qty 5

## 2024-04-18 MED ORDER — IPRATROPIUM-ALBUTEROL 0.5-2.5 (3) MG/3ML IN SOLN
RESPIRATORY_TRACT | Status: AC
  Filled 2024-04-18: qty 3

## 2024-04-18 MED ORDER — FENTANYL CITRATE (PF) 100 MCG/2ML IJ SOLN
INTRAMUSCULAR | Status: DC | PRN
  Administered 2024-04-18 (×2): 50 ug via INTRAVENOUS

## 2024-04-18 MED ORDER — OXYCODONE HCL 5 MG/5ML PO SOLN: 5.0000 mg | Freq: Once | ORAL | Status: DC | PRN

## 2024-04-18 MED ORDER — PROPOFOL 1000 MG/100ML IV EMUL
INTRAVENOUS | Status: AC
  Filled 2024-04-18: qty 100

## 2024-04-18 MED ORDER — MIDAZOLAM HCL (PF) 2 MG/2ML IJ SOLN
INTRAMUSCULAR | Status: DC | PRN
  Administered 2024-04-18: 1 mg via INTRAVENOUS

## 2024-04-18 MED ORDER — CHLORHEXIDINE GLUCONATE 0.12 % MT SOLN
15.0000 mL | Freq: Once | OROMUCOSAL | Status: AC
  Administered 2024-04-18: 15 mL via OROMUCOSAL

## 2024-04-18 MED ORDER — LACTATED RINGERS IV SOLN: INTRAVENOUS | Status: DC

## 2024-04-18 MED ORDER — OXYCODONE HCL 5 MG PO TABS: 5.0000 mg | ORAL_TABLET | Freq: Once | ORAL | Status: DC | PRN

## 2024-04-18 MED ORDER — GLYCOPYRROLATE 0.2 MG/ML IJ SOLN
INTRAMUSCULAR | Status: AC
  Filled 2024-04-18: qty 1

## 2024-04-18 MED ORDER — DROPERIDOL 2.5 MG/ML IJ SOLN: 0.6250 mg | Freq: Once | INTRAMUSCULAR | Status: DC | PRN

## 2024-04-18 MED ORDER — MIDAZOLAM HCL 2 MG/2ML IJ SOLN
INTRAMUSCULAR | Status: AC
  Filled 2024-04-18: qty 2

## 2024-04-18 MED ORDER — GLYCOPYRROLATE 0.2 MG/ML IJ SOLN
INTRAMUSCULAR | Status: DC | PRN
  Administered 2024-04-18: .2 mg via INTRAVENOUS

## 2024-04-18 MED ORDER — ONDANSETRON HCL 4 MG/2ML IJ SOLN
INTRAMUSCULAR | Status: AC
  Filled 2024-04-18: qty 2

## 2024-04-18 NOTE — Anesthesia Preprocedure Evaluation (Addendum)
 Anesthesia Evaluation  Patient identified by MRN, date of birth, ID band Patient awake    Reviewed: Allergy  & Precautions, H&P , NPO status , Patient's Chart, lab work & pertinent test results, reviewed documented beta blocker date and time   Airway Mallampati: II  TM Distance: >3 FB Neck ROM: full    Dental  (+) Teeth Intact   Pulmonary neg shortness of breath, former smoker   Pulmonary exam normal        Cardiovascular Exercise Tolerance: Good hypertension, On Medications + CAD  Normal cardiovascular exam Rhythm:regular Rate:Normal  June 2025 Echo stress test and EKG ok   Neuro/Psych   Anxiety Depression    negative neurological ROS  negative psych ROS   GI/Hepatic negative GI ROS, Neg liver ROS,,,  Endo/Other  negative endocrine ROS    Renal/GU negative Renal ROS  negative genitourinary   Musculoskeletal   Abdominal   Peds  Hematology negative hematology ROS (+)   Anesthesia Other Findings Past Medical History: No date: Allergy  No date: Coronary artery disease No date: Depression No date: Eczematous dermatitis of eyelid No date: Follicular lymphoma (HCC) No date: History of chicken pox No date: Hyperlipidemia No date: Hypertension No date: IBS (irritable bowel syndrome) No date: Nodule of upper lobe of right lung No date: Osteopenia Past Surgical History: 06/18/2015: BREAST BIOPSY; Left     Comment:  BENIGN BREAST TISSUE WITH STROMAL FIBROSIS AND               FIBROCYSTIC CHANGE.  11/30/2018: BREAST BIOPSY; Right     Comment:  affirm bx   ribbon marker   neg 06/05/2023: BREAST BIOPSY; Left     Comment:  us  bx 2:00 9 cmfn, heart marker, path pending 06/05/2023: BREAST BIOPSY; Left     Comment:  US  LT BREAST BX W LOC DEV 1ST LESION IMG BX SPEC US                GUIDE 06/05/2023 [facility] 1989: BREAST EXCISIONAL BIOPSY; Right 09/18/2015: COLONOSCOPY WITH PROPOFOL ; N/A     Comment:  Procedure:  COLONOSCOPY WITH PROPOFOL ;  Surgeon: Gladis RAYMOND Mariner, MD;  Location: [facility] ENDOSCOPY;  Service:               Endoscopy;  Laterality: N/A; 10/02/2021: COLONOSCOPY WITH PROPOFOL ; N/A     Comment:  Procedure: COLONOSCOPY WITH PROPOFOL ;  Surgeon: Toledo,               Ladell POUR, MD;  Location: [facility] ENDOSCOPY;  Service:               Gastroenterology;  Laterality: N/A; 10/02/2021: ESOPHAGOGASTRODUODENOSCOPY (EGD) WITH PROPOFOL ; N/A     Comment:  Procedure: ESOPHAGOGASTRODUODENOSCOPY (EGD) WITH               PROPOFOL ;  Surgeon: Toledo, Ladell POUR, MD;  Location:               [facility] ENDOSCOPY;  Service: Gastroenterology;  Laterality:               N/A; No date: TONSILLECTOMY No date: TUBAL LIGATION   Reproductive/Obstetrics negative OB ROS                              Anesthesia Physical Anesthesia Plan  ASA: 3  Anesthesia Plan: General ETT   Post-op Pain Management:    Induction:  PONV Risk Score and Plan: 4 or greater  Airway Management Planned:   Additional Equipment:   Intra-op Plan:   Post-operative Plan:   Informed Consent: I have reviewed the patients History and Physical, chart, labs and discussed the procedure including the risks, benefits and alternatives for the proposed anesthesia with the patient or authorized representative who has indicated his/her understanding and acceptance.     Dental Advisory Given  Plan Discussed with: CRNA  Anesthesia Plan Comments:          Anesthesia Quick Evaluation

## 2024-04-18 NOTE — Discharge Instructions (Signed)
 Video Bronchoscopy, Care After (includes Robotic and EBUS procedures) This sheet gives you information about how to care for yourself after your test. Your doctor may also give you more specific instructions. If you have problems or questions, contact your doctor. Follow these instructions at home: Eating and drinking When you are wide awake, your numbness is gone and your cough and gag reflexes have come back, you may: Start eating only soft foods. Slowly drink liquids. Six hours after the test, go back to your normal diet. Driving Do not drive for 24 hours if you were given a medicine to help you relax (sedative). Do not drive or use heavy machinery while taking prescription pain medicine. General instructions Take over-the-counter and prescription medicines only as told by your doctor. Return to your normal activities as told. Ask what activities are safe for you. Do not use any products that have nicotine or tobacco in them. This includes cigarettes and e-cigarettes. If you need help quitting, ask your doctor. Keep all follow-up visits as told by your doctor. This is important. It is very important if you had a tissue sample (biopsy) taken. Get help right away if: You have shortness of breath that gets worse. You get light-headed. You feel like you are going to pass out (faint). You have chest pain. You cough up: More than a little blood (i.e more than a tablespoon). More blood than before. Summary Do not use cigarettes. Do not use e-cigarettes. Seek care in the Emergency Department right away if you have chest pain or shortness of breath. Call or MyChart Message our office for any questions or problems at [phone].    This information is not intended to replace advice given to you by your health care provider. Make sure you discuss any questions you have with your health care provider.

## 2024-04-18 NOTE — Interval H&P Note (Signed)
 Nicole York has presented today for surgery, with the diagnosis of RIGHT UPPER LOBE NODULE.  The various methods of treatment have been discussed with the patient and family. After consideration of risks, benefits and other options for treatment, the patient has consented to  Procedure(s): ROBOTIC ASSISTED NAVIGATIONAL BRONCHOSCOPY-RIGHT as a surgical intervention.  The patient's history has been reviewed, patient examined, no change in status, stable for surgery.  I have reviewed the patient's chart and labs.  Questions were answered to the patient's satisfaction.  Benefits, limitations and potential complications of the procedure were discussed with the patient/family.  Complications from bronchoscopy are rare and most often minor, but if they occur they may include breathing difficulty, vocal cord spasm, hoarseness, slight fever, vomiting, dizziness, bronchospasm, infection, low blood oxygen, bleeding from biopsy site, or an allergic reaction to medications.  It is uncommon for patients to experience other more serious complications for example: Collapsed lung requiring chest tube placement, respiratory failure, heart attack and/or cardiac arrhythmia.  Patient agrees to proceed.  KYM Leita Sanders, MD Advanced Bronchoscopy PCCM [facility]    *This note was generated using voice recognition software/Dragon and/or AI transcription program.  Despite best efforts to proofread, errors can occur which can change the meaning. Any transcriptional errors that result from this process are unintentional and may not be fully corrected at the time of dictation.

## 2024-04-18 NOTE — Op Note (Signed)
 Video Bronchoscopy with Robotic Assisted Bronchoscopic Navigation   Date of Operation: 04/18/2024   Pre-op Diagnosis: Slowly enlarging right upper lobe nodule in a patient with history of follicular lymphoma stage I.  Rule out primary lung cancer versus lymphoma.  Post-op Diagnosis: Same as above  Surgeon: KYM Leita Sanders, MD  Assistant: Madelin Benders, RRT  Endo techs: Asberry Boehringer, Norleen Gurney Tzintzun  Industry representative: Rockey Hayward, Ion/Intuitive  Anesthesia: General endotracheal anesthesia  Operation: Flexible video bronchoscopy with robotic assistance and biopsies.  Estimated Blood Loss: Minimal  Complications: None  Indications and History: Nicole York is a 73 y.o. female, former smoker, with history of follicular lymphoma stage I now with enlarging RUL lung nodule, 11.2 mm.  Recommendation made to achieve a tissue diagnosis via robotic assisted navigational bronchoscopy.  The risks, benefits, complications, treatment options and expected outcomes were discussed with the patient.  The possibilities of pneumothorax, pneumonia, reaction to medication, pulmonary aspiration, perforation of a viscus, bleeding, failure to diagnose a condition and creating a complication requiring transfusion or operation were discussed with the patient who freely signed the consent.    Description of Procedure: The patient was seen in the Preoperative Area, was examined and was deemed appropriate to proceed.  The patient was taken to [facility] Procedure Room 2 (Bronchoscopy Suite), identified as Nicole York and the procedure verified as Robotic Assisted Video Bronchoscopy.  A Time Out was held and the above information confirmed.   Prior to the date of the procedure a high-resolution CT scan of the chest was performed. Utilizing ION software program a virtual tracheobronchial tree was generated to allow the creation of distinct navigation pathways to the patient's parenchymal abnormalities.  After being taken to the operating room general anesthesia was initiated and the patient  was orally intubated. The Olympus video bronchoscope was introduced via the endotracheal tube and a general inspection was performed which showed normal right and left lung anatomy. Aspiration of the bilateral mainstems was completed to remove any remaining secretions. Robotic catheter was then inserted into patient's endotracheal tube.   Target #1 RUL: The distinct navigation pathways prepared prior to this procedure were then utilized to navigate to patient's lesion identified on CT scan. The robotic catheter was secured into place and the vision probe was withdrawn.  Lesion location was approximated using fluoroscopy.  Local registration and targeting was performed using [employer] OEC 3D system mobile C-arm three-dimensional imaging. Under fluoroscopic guidance transbronchial needle biopsies and transbronchial forceps biopsies were performed to be sent for cytology, flow cytometry and pathology.  Tool-in-lesion was confirmed using [employer] OEC 3D system mobile C-arm. REBUS was utilized to further confirm target acquisition.  Rebus image was consistent with target acquisition.  A bronchioalveolar lavage was performed in the RUL and sent for cytology.  Under fluoroscopic guidance a single fiducial marker was placed adjacent to the nodule.  ROSE was not performed.  At the end of the procedure a general airway inspection was performed and there was no evidence of active bleeding. The bronchoscope was removed.  The patient tolerated the procedure well. There was no significant blood loss and there were no obvious complications. A post-procedural chest x-ray showed no pneumothorax.  Intraoperative images:  Scope at target:   REBUS images:      Samples Target #1: 1. Transbronchial brushings: N/A  2. Transbronchial needle biopsies from RUL: X 6 3. Transbronchial forceps biopsies from RUL: X 6 4.  Bronchoalveolar lavage from RUL: 6 mL aliquot 5.  Endobronchial biopsies from N/A    Impression: Right upper lobe lung nodule rule out primary lung carcinoma versus follicular lymphoma status post technically successful robotic assisted navigational bronchoscopy with biopsies.   Plan:  We will review the cytology, pathology and flow cytometry results with the patient when they become available.  Outpatient followup will be with pulmonary/oncology as scheduled.    KYM Leita Sanders, MD Advanced Bronchoscopy PCCM  [facility]    *This note was generated using voice recognition software/Dragon and/or AI transcription program.  Despite best efforts to proofread, errors can occur which can change the meaning. Any transcriptional errors that result from this process are unintentional and may not be fully corrected at the time of dictation.

## 2024-04-18 NOTE — Anesthesia Procedure Notes (Signed)
 Procedure Name: Intubation Date/Time: 04/18/2024 8:03 AM  Performed by: Dominica Krabbe, CRNAPre-anesthesia Checklist: Patient identified, Emergency Drugs available, Suction available, Patient being monitored and Timeout performed Patient Re-evaluated:Patient Re-evaluated prior to induction Oxygen Delivery Method: Circle system utilized Preoxygenation: Pre-oxygenation with 100% oxygen Induction Type: IV induction Ventilation: Mask ventilation without difficulty Laryngoscope Size: McGrath and 3 Grade View: Grade II Tube type: Oral Tube size: 8.0 mm Number of attempts: 1 Airway Equipment and Method: Stylet and Video-laryngoscopy Placement Confirmation: ETT inserted through vocal cords under direct vision, positive ETCO2 and breath sounds checked- equal and bilateral Secured at: 21 cm Tube secured with: Tape Dental Injury: Teeth and Oropharynx as per pre-operative assessment  Difficulty Due To: Difficulty was anticipated, Difficult Airway- due to limited oral opening and Difficult Airway- due to anterior larynx

## 2024-04-18 NOTE — Transfer of Care (Signed)
 Immediate Anesthesia Transfer of Care Note  Patient: Nicole York  Procedure(s) Performed: VIDEO BRONCHOSCOPY WITH ENDOBRONCHIAL NAVIGATION (Right)  Patient Location: PACU  Anesthesia Type:General  Level of Consciousness: awake, alert , and oriented  Airway & Oxygen Therapy: Patient Spontanous Breathing and Patient connected to face mask oxygen  Post-op Assessment: Report given to RN  Post vital signs: Reviewed and stable  Last Vitals:  Vitals Value Taken Time  BP 128/62 04/18/24 09:15  Temp    Pulse 74 04/18/24 09:15  Resp 19 04/18/24 09:15  SpO2 100 % 04/18/24 09:15    Last Pain:  Vitals:   04/18/24 0720  TempSrc: Temporal  PainSc: 0-No pain         Complications: No notable events documented.

## 2024-04-18 NOTE — Procedures (Signed)
 Date of Procedure: 04/18/2024   Pre-op Diagnosis: RUL lung nodule, 11.2 mm, rule out metastatic carcinoma  Post-op Diagnosis: Same as above   Surgeon: KYM Leita Sanders, MD  Assistant: Madelin Benders, RRT  Endo techs: Asberry Boehringer, Norleen Gurney Tzintzun  Fluoroscopy technician: Joesph Ahle, RTR   Anesthesia: General endotracheal anesthesia, see anesthesia record   Operation: Bronchoscopy with robotic assistance and biopsies.   Estimated Blood Loss: Minimal   Complications: None  Procedure Note:  Clinical indication Evaluation of a newly detected peripheral pulmonary nodule, 11.2 mm, in the RIGHT UPPER LOBE, identified on a recent follow-up FDG-PET/CT.   Technique Intraoperative Cone Beam Computed Tomography (CBCT) images were acquired using a [employer] OEC 3D system, optimized for thoracic imaging. A high-quality 30-second acquisition protocol was used. The patient was positioned supine on the examination table. Pre-biopsy CBCT scans were performed to confirm the lesion's visibility and to determine the optimal approach. Navigation was performed using an [employer] ([employer]uitive). Repeat CBCT scans were performed to confirm tool-in-lesion placement and to assess for complications.   Findings RIGHT UPPER LOBE Peripheral Pulmonary Nodule: A solid nodule, measuring approximately 11.2 mm in diameter, is identified in the right upper lobe. The nodule exhibits a mixed/subsolid appearance. Bronchial Anatomy: Patent airways are visualized up to the segmental bronchi leading towards the target nodule. Navigation: The Ion bronchoscopic navigation successfully acquired the target and guided biopsy tools towards the right upper lobe nodule. Tool-in-Lesion Confirmation: Intraoperative CBCT confirmed the accurate positioning of the biopsy forceps within the nodule. Complications: No immediate complications such as pneumothorax or hemorrhage were observed during or immediately  following the procedure.   Total fluoroscopy time/dose 3 minutes 5 seconds 109.7 mGy total dose    Impression CBCT-guided navigation bronchoscopy with biopsy was performed for the peripheral pulmonary nodule in the right upper lobe. The procedure was technically successful with confirmed tool-in-lesion placement. Samples were obtained for histological and cytological examination.   Recommendations Submit biopsy samples for histopathological and cytological analysis. Images were uploaded to PACS. Schedule follow-up appointment with the referring physician to discuss the results and treatment plan. Monitor for delayed complications such as pneumothorax or hemorrhage.   Please see associated operative report for robotic bronchoscopy description.  KYM Leita Sanders, MD Advanced Bronchoscopy PCCM [facility]    *This note was generated using voice recognition software/Dragon and/or AI transcription program.  Despite best efforts to proofread, errors can occur which can change the meaning. Any transcriptional errors that result from this process are unintentional and may not be fully corrected at the time of dictation.

## 2024-04-19 ENCOUNTER — Encounter: Payer: Self-pay | Admitting: Pulmonary Disease

## 2024-04-20 ENCOUNTER — Ambulatory Visit: Payer: Self-pay | Admitting: Pulmonary Disease

## 2024-04-20 LAB — CYTOLOGY - NON PAP

## 2024-04-20 LAB — SURGICAL PATHOLOGY

## 2024-04-20 NOTE — Progress Notes (Signed)
 Spoke with Nicole York and scheduled to see Dr. Tamea for 07/08/23 to review CT

## 2024-04-21 ENCOUNTER — Other Ambulatory Visit: Payer: Self-pay

## 2024-05-02 NOTE — Anesthesia Postprocedure Evaluation (Signed)
 Anesthesia Post Note  Patient: Nicole York  Procedure(s) Performed: VIDEO BRONCHOSCOPY WITH ENDOBRONCHIAL NAVIGATION (Right)  Patient location during evaluation: PACU Anesthesia Type: General Level of consciousness: awake and alert Pain management: pain level controlled Vital Signs Assessment: post-procedure vital signs reviewed and stable Respiratory status: spontaneous breathing, nonlabored ventilation, respiratory function stable and patient connected to nasal cannula oxygen Cardiovascular status: blood pressure returned to baseline and stable Postop Assessment: no apparent nausea or vomiting Anesthetic complications: no   No notable events documented.   Last Vitals:  Vitals:   04/18/24 0945 04/18/24 1003  BP: 136/68 (!) (P) 141/73  Pulse: 63 (P) 67  Resp: 20 (P) 15  Temp:  (!) (P) 36.4 C  SpO2: 93% (P) 98%    Last Pain:  Vitals:   04/18/24 1003  TempSrc: (P) Temporal  PainSc:                  [place] KANDICE Clause

## 2024-06-10 ENCOUNTER — Inpatient Hospital Stay: Attending: Oncology

## 2024-06-10 ENCOUNTER — Inpatient Hospital Stay: Admitting: Oncology

## 2024-06-10 ENCOUNTER — Other Ambulatory Visit

## 2024-06-10 ENCOUNTER — Ambulatory Visit: Admitting: Oncology

## 2024-06-10 ENCOUNTER — Encounter: Payer: Self-pay | Admitting: Oncology

## 2024-06-10 VITALS — BP 133/76 | HR 80 | Temp 97.6°F | Resp 19 | Wt 128.6 lb

## 2024-06-10 DIAGNOSIS — Z87891 Personal history of nicotine dependence: Secondary | ICD-10-CM | POA: Diagnosis not present

## 2024-06-10 DIAGNOSIS — C829A Follicular lymphoma, unspecified, in remission: Secondary | ICD-10-CM | POA: Diagnosis not present

## 2024-06-10 DIAGNOSIS — C8209 Follicular lymphoma grade I, extranodal and solid organ sites: Secondary | ICD-10-CM

## 2024-06-10 DIAGNOSIS — Z803 Family history of malignant neoplasm of breast: Secondary | ICD-10-CM

## 2024-06-10 DIAGNOSIS — Z79899 Other long term (current) drug therapy: Secondary | ICD-10-CM | POA: Diagnosis not present

## 2024-06-10 DIAGNOSIS — Z08 Encounter for follow-up examination after completed treatment for malignant neoplasm: Secondary | ICD-10-CM

## 2024-06-10 DIAGNOSIS — R911 Solitary pulmonary nodule: Secondary | ICD-10-CM

## 2024-06-10 LAB — CBC WITH DIFFERENTIAL (CANCER CENTER ONLY)
Abs Immature Granulocytes: 0.02 K/uL (ref 0.00–0.07)
Basophils Absolute: 0 K/uL (ref 0.0–0.1)
Basophils Relative: 0 %
Eosinophils Absolute: 0 K/uL (ref 0.0–0.5)
Eosinophils Relative: 1 %
HCT: 43.6 % (ref 36.0–46.0)
Hemoglobin: 14.8 g/dL (ref 12.0–15.0)
Immature Granulocytes: 0 %
Lymphocytes Relative: 20 %
Lymphs Abs: 1.4 K/uL (ref 0.7–4.0)
MCH: 29.8 pg (ref 26.0–34.0)
MCHC: 33.9 g/dL (ref 30.0–36.0)
MCV: 87.7 fL (ref 80.0–100.0)
Monocytes Absolute: 0.5 K/uL (ref 0.1–1.0)
Monocytes Relative: 8 %
Neutro Abs: 4.8 K/uL (ref 1.7–7.7)
Neutrophils Relative %: 71 %
Platelet Count: 247 K/uL (ref 150–400)
RBC: 4.97 MIL/uL (ref 3.87–5.11)
RDW: 13.2 % (ref 11.5–15.5)
WBC Count: 6.8 K/uL (ref 4.0–10.5)
nRBC: 0 % (ref 0.0–0.2)

## 2024-06-10 LAB — CMP (CANCER CENTER ONLY)
ALT: 26 U/L (ref 0–44)
AST: 31 U/L (ref 15–41)
Albumin: 4.5 g/dL (ref 3.5–5.0)
Alkaline Phosphatase: 97 U/L (ref 38–126)
Anion gap: 10 (ref 5–15)
BUN: 14 mg/dL (ref 8–23)
CO2: 29 mmol/L (ref 22–32)
Calcium: 9.8 mg/dL (ref 8.9–10.3)
Chloride: 96 mmol/L — ABNORMAL LOW (ref 98–111)
Creatinine: 0.81 mg/dL (ref 0.44–1.00)
GFR, Estimated: >60 mL/min
Glucose, Bld: 87 mg/dL (ref 70–99)
Potassium: 3.9 mmol/L (ref 3.5–5.1)
Sodium: 135 mmol/L (ref 135–145)
Total Bilirubin: 0.5 mg/dL (ref 0.0–1.2)
Total Protein: 7.2 g/dL (ref 6.5–8.1)

## 2024-06-10 LAB — LACTATE DEHYDROGENASE: LDH: 195 U/L (ref 105–235)

## 2024-06-10 NOTE — Progress Notes (Signed)
 "    Hematology/Oncology Consult note [facility]  [phone] [phone]  Patient Care Team: Alla Amis, MD as PCP - General (Family Medicine) Alla Amis, MD as Referring Physician (Family Medicine)   Name of the patient: Nicole York  [id number]  [birth year]   Date of visit: 06/10/24  Diagnosis-  low-grade follicular lymphoma stage IV currently in remission   Chief complaint/ Reason for visit-routine follow-up of follicular lymphoma currently in remission  Heme/Onc history: Patient is a 74 year old female with a past medical history signal taken for hyperlipidemia osteopenia IBS who underwent CT chest as a part of lung cancer screening program which incidentally showed a 9 mm nodule in the lateral aspect of the left breast which was more conspicuous as compared to what was noted on CT in October 2023 when it was 6 mm.  This was followed by a bilateral diagnostic mammogram which showed that this 9 mm mass appears to be an intramammary lymph node in the left upper outer breast.  There were no suspicious breast masses noted in the left breast.  No findings suggestive of malignancy in the right breast.  Patient underwent core biopsy of this intramammary lymph node which showed atypical nodular B-cell infiltrate favoring a lymphoproliferative disorder.   THE SPECIMEN CONSISTS OF FRAGMENTED CORES OF LYMPHOID TISSUE WITH A      NODULAR ARCHITECTURE CONSISTING OF AN MOSTLY  UNPOLARIZED FOLLICLES BUT WITH      RARE TINGIBLE BODY MACROPHAGE.  CD20 HIGHLIGHTS THE FOLLICLES AS WELL AS      PARAFOLLICULAR SMALL B CELL WHICH ARE ALSO PREDOMINANTLY CD10 AND BCL6 POSITIVE.      BCL-2 IS ALSO POSITIVE IN THE MAJORITY OF THE CELLS, BUT IS FOCALLY WEAK/ABSENT      IN AREAS OF THE FOLLICLES.  CD23 HIGHLIGHTS AND EXPANDED FOLLICULAR DENDRITIC      CELL MESHWORK AS WELL AS POSITIVITY ON THE B CELLS.  THE PROLIFERATION RATE BY      KI-67 IS LOW (LESS  THAN 5%) AND IS ABNORMALLY LOW IN THE AREA OF FOLLICLES.      CD3/CD5/CD43 HIGHLIGHTS SMALL BACKGROUND T CELLS.  CYCLIN D1 IS NEGATIVE.  FLOW      CYTOMETRY SHOWS A DUAL POPULATION OF B CELLS WITH A SUBSET SHOWING POLYCLONAL B      CELLS AND ANOTHER SUBSET THAT IS CD10 POSITIVE AND OVERALL APPEARS DIM FOR      LAMBDA BUT NOT DEFINITIVELY PATHOGNOMONIC FOR LAMBDA RESTRICTION.  THE OVERALL      FINDINGS ARE ATYPICAL AND FAVOR A LYMPHOPROLIFERATIVE DISORDER WITH A      DIFFERENTIAL DIAGNOSIS INCLUDING BOTH A FOLLICULAR LYMPHOMA (FAVORED) AS WELL AS      A MARGINAL ZONE LYMPHOMA WITH FOLLICULAR COLONIZATION.  TO FURTHER      ASSESS/CHARACTERIZE THE LESION MOLECULAR TESTING FOR AN IGH GENE REARRANGEMENT      AS WELL AS LOW-GRADE B-CELL LYMPHOMA.IgH/Bcl-2 consistent with morphologic impression of follicular lymphoma   PET CT scan showed multifocal areas of abnormal uptake in the skeleton including the ribs sternum multiple vertebral bodies and right humeral head.  Patient underwent bone biopsy involving the area of iliac bone surrounding the sacrum pathology showed atypical lymphoid infiltrate with lambda predominance suspicious for lymphoproliferative disorder but definitive diagnosis could not be made due to predominant crush artifact. The B cells do not appear to express CD5, CD10, CD21 or cyclin D1; however, there is some nonspecific staining present. Bcl-2 and Bcl-6 are positive but difficult to assess  a distinct population of B-cells. Kappa and lambda by in situ hybridization highlight polytypic plasma cells.  EBV by in situ hybridization is negative.  Flow cytometry performed on the sample ([id number]) identifies a B-cell population (CD19, CD20, CD22) with lambda excess comprising 13% of lymphocytes. Overall, the features are worrisome for a lymphoproliferative process      Patient had 4 weekly cycles of Rituxan  in March 2025. PET CT scan in October 2025 showed no hypermetabolic findings to indicate  active lymphoma  Interval history- Nicole York is a 74 year old female with follicular lymphoma in remission and a stable pulmonary nodule who presents for routine hematology/oncology follow-up and imaging surveillance.  Her last PET scan was in October 2025. She had a chest CT in November 2025 to monitor a pulmonary nodule. Multiple imaging studies have been performed over the past several months.  She reports no new or concerning symptoms related to lymphoma or pulmonary disease. She denies chest pain, dyspnea, or other pulmonary complaints.  She continues to experience significant gastrointestinal symptoms consistent with irritable bowel syndrome, describing ongoing severe abdominal issues. She is awaiting a gastroenterology appointment, which has been rescheduled to early March 2026. No other acute complaints were discussed.       ECOG PS- 0 Pain scale- 0   Review of systems- Review of Systems  Constitutional:  Negative for chills, fever, malaise/fatigue and weight loss.  HENT:  Negative for congestion, ear discharge and nosebleeds.   Eyes:  Negative for blurred vision.  Respiratory:  Negative for cough, hemoptysis, sputum production, shortness of breath and wheezing.   Cardiovascular:  Negative for chest pain, palpitations, orthopnea and claudication.  Gastrointestinal:  Negative for abdominal pain, blood in stool, constipation, diarrhea, heartburn, melena, nausea and vomiting.  Genitourinary:  Negative for dysuria, flank pain, frequency, hematuria and urgency.  Musculoskeletal:  Negative for back pain, joint pain and myalgias.  Skin:  Negative for rash.  Neurological:  Negative for dizziness, tingling, focal weakness, seizures, weakness and headaches.  Endo/Heme/Allergies:  Does not bruise/bleed easily.  Psychiatric/Behavioral:  Negative for depression and suicidal ideas. The patient does not have insomnia.       Allergies[1]   Past Medical History:  Diagnosis  Date   Allergy     Coronary artery disease    Depression    Eczematous dermatitis of eyelid    Follicular lymphoma (HCC)    History of chicken pox    Hyperlipidemia    Hypertension    IBS (irritable bowel syndrome)    Nodule of upper lobe of right lung    Osteopenia      Past Surgical History:  Procedure Laterality Date   BREAST BIOPSY Left 06/18/2015   BENIGN BREAST TISSUE WITH STROMAL FIBROSIS AND FIBROCYSTIC CHANGE.    BREAST BIOPSY Right 11/30/2018   affirm bx   ribbon marker   neg   BREAST BIOPSY Left 06/05/2023   us  bx 2:00 9 cmfn, heart marker, path pending   BREAST BIOPSY Left 06/05/2023   US  LT BREAST BX W LOC DEV 1ST LESION IMG BX SPEC US  GUIDE 06/05/2023 [facility]   BREAST EXCISIONAL BIOPSY Right 1989   COLONOSCOPY WITH PROPOFOL  N/A 09/18/2015   Procedure: COLONOSCOPY WITH PROPOFOL ;  Surgeon: Gladis RAYMOND Mariner, MD;  Location: [facility] ENDOSCOPY;  Service: Endoscopy;  Laterality: N/A;   COLONOSCOPY WITH PROPOFOL  N/A 10/02/2021   Procedure: COLONOSCOPY WITH PROPOFOL ;  Surgeon: Toledo, Ladell POUR, MD;  Location: [facility] ENDOSCOPY;  Service: Gastroenterology;  Laterality: N/A;   ESOPHAGOGASTRODUODENOSCOPY (EGD)  WITH PROPOFOL  N/A 10/02/2021   Procedure: ESOPHAGOGASTRODUODENOSCOPY (EGD) WITH PROPOFOL ;  Surgeon: Toledo, Ladell POUR, MD;  Location: [facility] ENDOSCOPY;  Service: Gastroenterology;  Laterality: N/A;   TONSILLECTOMY     TUBAL LIGATION     VIDEO BRONCHOSCOPY WITH ENDOBRONCHIAL NAVIGATION Right 04/18/2024   Procedure: VIDEO BRONCHOSCOPY WITH ENDOBRONCHIAL NAVIGATION;  Surgeon: Tamea Dedra CROME, MD;  Location: [facility] ORS;  Service: Pulmonary;  Laterality: Right;    Social History   Socioeconomic History   Marital status: Married    Spouse name: Not on file   Number of children: Not on file   Years of education: Not on file   Highest education level: Not on file  Occupational History   Not on file  Tobacco Use   Smoking status: Former    Current packs/day: 0.00     Average packs/day: 1 pack/day for 43.0 years (43.0 ttl pk-yrs)    Types: Cigarettes    Start date: 05/05/1971    Quit date: 05/04/2014    Years since quitting: 10.1   Smokeless tobacco: Never  Vaping Use   Vaping status: Never Used  Substance and Sexual Activity   Alcohol use: Not Currently    Comment: rare   Drug use: No   Sexual activity: Not on file  Other Topics Concern   Not on file  Social History Narrative   Lives with [name], husband. No indoor pets.   Social Drivers of Health   Tobacco Use: Medium Risk (06/10/2024)   Patient History    Smoking Tobacco Use: Former    Smokeless Tobacco Use: Never    Passive Exposure: Not on file  Financial Resource Strain: Low Risk  (03/04/2024)   Received from [facility]   Overall Financial Resource Strain (CARDIA)    Difficulty of Paying Living Expenses: Not hard at all  Food Insecurity: No Food Insecurity (03/04/2024)   Received from [facility]   Epic    Within the past 12 months, you worried that your food would run out before you got the money to buy more.: Never true    Within the past 12 months, the food you bought just didn't last and you didn't have money to get more.: Never true  Transportation Needs: No Transportation Needs (03/04/2024)   Received from [facility] - Transportation    In the past 12 months, has lack of transportation kept you from medical appointments or from getting medications?: No    Lack of Transportation (Non-Medical): No  Physical Activity: Not on file  Stress: Not on file  Social Connections: Not on file  Intimate Partner Violence: Not At Risk (06/16/2023)   Humiliation, Afraid, Rape, and Kick questionnaire    Fear of Current or Ex-Partner: No    Emotionally Abused: No    Physically Abused: No    Sexually Abused: No  Depression (PHQ2-9): Low Risk (02/10/2024)   Depression (PHQ2-9)    PHQ-2 Score: 0  Alcohol Screen: Not on file   Housing: Low Risk  (03/04/2024)   Received from [facility]   Epic    In the last 12 months, was there a time when you were not able to pay the mortgage or rent on time?: No    In the past 12 months, how many times have you moved where you were living?: 0    At any time in the past 12 months, were you homeless or living in a shelter (including now)?:  No  Utilities: Not At Risk (03/04/2024)   Received from [facility]   Epic    In the past 12 months has the electric, gas, oil, or water  company threatened to shut off services in your home?: No  Health Literacy: Not on file    Family History  Problem Relation Age of Onset   Breast cancer Mother    Hyperlipidemia Mother    Cancer Mother 90       breast   Mental illness Mother        Alzheimers Dementia   Hyperlipidemia Father    Hypertension Father     Current Medications[2]  Physical exam:  Vitals:   06/10/24 0926  BP: 133/76  Pulse: 80  Resp: 19  Temp: 97.6 F (36.4 C)  SpO2: 99%  Weight: 128 lb 9.6 oz (58.3 kg)   Physical Exam Cardiovascular:     Rate and Rhythm: Normal rate and regular rhythm.     Heart sounds: Normal heart sounds.  Pulmonary:     Effort: Pulmonary effort is normal.     Breath sounds: Normal breath sounds.  Abdominal:     General: Bowel sounds are normal.     Palpations: Abdomen is soft.     Comments: No palpable hepatosplenomegaly  Lymphadenopathy:     Comments: No palpable cervical, supraclavicular, axillary or inguinal adenopathy    Skin:    General: Skin is warm and dry.  Neurological:     Mental Status: She is alert and oriented to person, place, and time.      I have personally reviewed labs listed below:    Latest Ref Rng & Units 06/10/2024    9:15 AM  CMP  Glucose 70 - 99 mg/dL 87   BUN 8 - 23 mg/dL 14   Creatinine 9.55 - 1.00 mg/dL 9.18   Sodium 864 - 854 mmol/L 135   Potassium 3.5 - 5.1 mmol/L 3.9   Chloride 98 - 111 mmol/L 96   CO2  22 - 32 mmol/L 29   Calcium 8.9 - 10.3 mg/dL 9.8   Total Protein 6.5 - 8.1 g/dL 7.2   Total Bilirubin 0.0 - 1.2 mg/dL 0.5   Alkaline Phos 38 - 126 U/L 97   AST 15 - 41 U/L 31   ALT 0 - 44 U/L 26       Latest Ref Rng & Units 06/10/2024    9:15 AM  CBC  WBC 4.0 - 10.5 K/uL 6.8   Hemoglobin 12.0 - 15.0 g/dL 85.1   Hematocrit 63.9 - 46.0 % 43.6   Platelets 150 - 400 K/uL 247      Assessment and plan- Patient is a 74 y.o. female with history of low-grade follicular lymphoma stage IV s/p 4 cycles of Rituxan .  She is here for a routine surveillance visit for follicular lymphoma  Assessment and Plan    Follicular lymphoma In remission with no active disease. Negative PET and CT scans. Normal labs. Prior therapy resolved bone involvement. - - Proceed with February CT scan to monitor lung nodule and assess chest lymph nodes and osseous structures. - Reviewed and confirmed normal labs and absence of lymphoma-related symptoms.    - I will see her back in 4 months with CBC with differential CMP and LDH.  I will consider getting repeat PET scan in October 2026.  She has been getting periodic CT chest to follow-up on her lung nodule which has not shown any evidence of bone lesions  or adenopathy and I am therefore holding off on getting PET/CT scan any sooner     Visit Diagnosis 1. Encounter for follow-up surveillance of lymphoma      Dr. Annah Skene, MD, MPH [facility] [phone] 06/10/2024 12:43 PM                   [1] No Known Allergies [2]  Current Outpatient Medications:    Ascorbic Acid (VITAMIN C) 1000 MG tablet, Take 1,000 mg by mouth daily., Disp: , Rfl:    aspirin 81 MG EC tablet, Take 81 mg by mouth daily., Disp: , Rfl:    atorvastatin (LIPITOR) 40 MG tablet, Take 1 tablet by mouth at bedtime., Disp: , Rfl:    Calcium Citrate-Vitamin D  (CITRACAL + D PO), Take 1 tablet by mouth daily at 8 pm., Disp: , Rfl:    cetirizine (ZYRTEC) 10  MG tablet, Take 10 mg by mouth every morning., Disp: , Rfl:    chlorthalidone (HYGROTON) 25 MG tablet, Take 1 tablet by mouth every morning., Disp: , Rfl:    cholecalciferol (VITAMIN D ) 1000 UNITS tablet, Take 1,000 Units by mouth daily.  , Disp: , Rfl:    cyanocobalamin (VITAMIN B12) 1000 MCG tablet, Take 1,000 mcg by mouth daily., Disp: , Rfl:    escitalopram  (LEXAPRO ) 5 MG tablet, Take 1 tablet (5 mg total) by mouth at bedtime. (Patient taking differently: Take 10 mg by mouth every morning.), Disp: 30 tablet, Rfl: 0   ferrous sulfate 324 (65 Fe) MG TBEC, Take 1 tablet by mouth daily at 6 (six) AM., Disp: , Rfl:    fish oil-omega-3 fatty acids 1000 MG capsule, Take 2 g by mouth daily.  , Disp: , Rfl:    fluticasone  (FLONASE ) 50 MCG/ACT nasal spray, USE 2 SPRAY IN EACH NOSTRIL EVERY DAY, Disp: 16 g, Rfl: 0   MULTIPLE VITAMIN PO, Take 1 tablet by mouth daily., Disp: , Rfl:    traZODone (DESYREL) 50 MG tablet, Take 50 mg by mouth at bedtime., Disp: , Rfl:    vitamin E 400 UNIT capsule, Take 400 Units by mouth daily.  , Disp: , Rfl:   "

## 2024-06-11 ENCOUNTER — Other Ambulatory Visit: Payer: Self-pay

## 2024-06-19 ENCOUNTER — Other Ambulatory Visit: Payer: Self-pay

## 2024-06-20 ENCOUNTER — Encounter: Payer: Self-pay | Admitting: Oncology

## 2024-06-28 ENCOUNTER — Ambulatory Visit

## 2024-07-07 ENCOUNTER — Ambulatory Visit: Admitting: Pulmonary Disease

## 2024-10-11 ENCOUNTER — Inpatient Hospital Stay

## 2024-10-11 ENCOUNTER — Inpatient Hospital Stay: Admitting: Oncology
